# Patient Record
Sex: Male | Born: 1975 | Race: White | Hispanic: No | Marital: Married | Smoking: Current every day smoker
Health system: Southern US, Community
[De-identification: ages and names within clinical notes are randomized; demographics above are authoritative.]

## PROBLEM LIST (undated history)

## (undated) DIAGNOSIS — F419 Anxiety disorder, unspecified: Secondary | ICD-10-CM

## (undated) DIAGNOSIS — I10 Essential (primary) hypertension: Secondary | ICD-10-CM

## (undated) DIAGNOSIS — E785 Hyperlipidemia, unspecified: Secondary | ICD-10-CM

## (undated) HISTORY — DX: Hyperlipidemia, unspecified: E78.5

## (undated) HISTORY — DX: Essential (primary) hypertension: I10

## (undated) HISTORY — DX: Anxiety disorder, unspecified: F41.9

---

## 2006-11-14 ENCOUNTER — Ambulatory Visit: Payer: Self-pay | Admitting: Family Medicine

## 2007-02-07 ENCOUNTER — Emergency Department: Payer: Self-pay | Admitting: Emergency Medicine

## 2011-08-21 ENCOUNTER — Emergency Department: Payer: Self-pay | Admitting: Emergency Medicine

## 2011-08-21 LAB — BASIC METABOLIC PANEL
Calcium, Total: 9.4 mg/dL (ref 8.5–10.1)
Chloride: 103 mmol/L (ref 98–107)
Creatinine: 0.94 mg/dL (ref 0.60–1.30)
EGFR (Non-African Amer.): >60

## 2011-08-21 LAB — CBC
HCT: 45.9 % (ref 40.0–52.0)
MCHC: 33.8 g/dL (ref 32.0–36.0)
MCV: 92 fL (ref 80–100)
Platelet: 218 10*3/uL (ref 150–440)
RBC: 5.01 10*6/uL (ref 4.40–5.90)
WBC: 8.9 10*3/uL (ref 3.8–10.6)

## 2011-08-21 LAB — CK TOTAL AND CKMB (NOT AT ARMC)
CK, Total: 101 U/L (ref 35–232)
CK-MB: 0.7 ng/mL (ref 0.5–3.6)

## 2011-08-21 LAB — TROPONIN I: Troponin-I: <0.02 ng/mL

## 2012-04-16 ENCOUNTER — Ambulatory Visit: Payer: Self-pay | Admitting: Orthopedic Surgery

## 2013-05-18 ENCOUNTER — Ambulatory Visit: Payer: Self-pay | Admitting: Physician Assistant

## 2014-03-31 ENCOUNTER — Ambulatory Visit: Payer: Self-pay | Admitting: Physician Assistant

## 2014-06-17 HISTORY — PX: OTHER SURGICAL HISTORY: SHX169

## 2015-07-13 DIAGNOSIS — F17210 Nicotine dependence, cigarettes, uncomplicated: Secondary | ICD-10-CM | POA: Diagnosis not present

## 2015-07-13 DIAGNOSIS — R1314 Dysphagia, pharyngoesophageal phase: Secondary | ICD-10-CM | POA: Diagnosis not present

## 2015-07-13 DIAGNOSIS — R0602 Shortness of breath: Secondary | ICD-10-CM | POA: Diagnosis not present

## 2015-07-14 ENCOUNTER — Other Ambulatory Visit: Payer: Self-pay | Admitting: Physician Assistant

## 2015-07-14 DIAGNOSIS — R1310 Dysphagia, unspecified: Secondary | ICD-10-CM

## 2015-07-21 ENCOUNTER — Ambulatory Visit
Admission: RE | Admit: 2015-07-21 | Discharge: 2015-07-21 | Disposition: A | Discharge status: Home / Self Care | Payer: BLUE CROSS/BLUE SHIELD | Source: Ambulatory Visit | Attending: Physician Assistant | Admitting: Physician Assistant

## 2015-07-21 ENCOUNTER — Ambulatory Visit: Admission: RE | Admit: 2015-07-21 | Payer: BLUE CROSS/BLUE SHIELD | Source: Ambulatory Visit

## 2015-07-21 DIAGNOSIS — R1310 Dysphagia, unspecified: Secondary | ICD-10-CM | POA: Diagnosis not present

## 2015-07-21 DIAGNOSIS — K219 Gastro-esophageal reflux disease without esophagitis: Secondary | ICD-10-CM | POA: Insufficient documentation

## 2015-07-21 DIAGNOSIS — R1314 Dysphagia, pharyngoesophageal phase: Secondary | ICD-10-CM | POA: Diagnosis not present

## 2015-07-31 DIAGNOSIS — S60451A Superficial foreign body of left index finger, initial encounter: Secondary | ICD-10-CM | POA: Diagnosis not present

## 2015-07-31 DIAGNOSIS — Z887 Allergy status to serum and vaccine status: Secondary | ICD-10-CM | POA: Diagnosis not present

## 2015-07-31 DIAGNOSIS — S62661A Nondisplaced fracture of distal phalanx of left index finger, initial encounter for closed fracture: Secondary | ICD-10-CM | POA: Diagnosis not present

## 2015-08-01 DIAGNOSIS — S60451A Superficial foreign body of left index finger, initial encounter: Secondary | ICD-10-CM | POA: Diagnosis not present

## 2015-08-16 DIAGNOSIS — R1314 Dysphagia, pharyngoesophageal phase: Secondary | ICD-10-CM | POA: Diagnosis not present

## 2015-08-16 DIAGNOSIS — K219 Gastro-esophageal reflux disease without esophagitis: Secondary | ICD-10-CM | POA: Diagnosis not present

## 2015-09-07 DIAGNOSIS — Z6832 Body mass index (BMI) 32.0-32.9, adult: Secondary | ICD-10-CM | POA: Diagnosis not present

## 2015-09-07 DIAGNOSIS — K6289 Other specified diseases of anus and rectum: Secondary | ICD-10-CM | POA: Diagnosis not present

## 2015-09-07 DIAGNOSIS — K611 Rectal abscess: Secondary | ICD-10-CM | POA: Diagnosis not present

## 2015-09-07 DIAGNOSIS — K649 Unspecified hemorrhoids: Secondary | ICD-10-CM | POA: Diagnosis not present

## 2015-12-15 DIAGNOSIS — R1314 Dysphagia, pharyngoesophageal phase: Secondary | ICD-10-CM | POA: Diagnosis not present

## 2015-12-15 DIAGNOSIS — R635 Abnormal weight gain: Secondary | ICD-10-CM | POA: Diagnosis not present

## 2015-12-15 DIAGNOSIS — E291 Testicular hypofunction: Secondary | ICD-10-CM | POA: Diagnosis not present

## 2015-12-15 DIAGNOSIS — Z23 Encounter for immunization: Secondary | ICD-10-CM | POA: Diagnosis not present

## 2016-03-14 DIAGNOSIS — R05 Cough: Secondary | ICD-10-CM | POA: Diagnosis not present

## 2016-03-14 DIAGNOSIS — J209 Acute bronchitis, unspecified: Secondary | ICD-10-CM | POA: Diagnosis not present

## 2016-04-17 DIAGNOSIS — F17210 Nicotine dependence, cigarettes, uncomplicated: Secondary | ICD-10-CM | POA: Diagnosis not present

## 2016-04-17 DIAGNOSIS — Z0001 Encounter for general adult medical examination with abnormal findings: Secondary | ICD-10-CM | POA: Diagnosis not present

## 2016-04-17 DIAGNOSIS — I10 Essential (primary) hypertension: Secondary | ICD-10-CM | POA: Diagnosis not present

## 2016-04-17 DIAGNOSIS — E782 Mixed hyperlipidemia: Secondary | ICD-10-CM | POA: Diagnosis not present

## 2016-04-17 DIAGNOSIS — E291 Testicular hypofunction: Secondary | ICD-10-CM | POA: Diagnosis not present

## 2016-04-24 DIAGNOSIS — I10 Essential (primary) hypertension: Secondary | ICD-10-CM | POA: Diagnosis not present

## 2016-04-24 DIAGNOSIS — E782 Mixed hyperlipidemia: Secondary | ICD-10-CM | POA: Diagnosis not present

## 2016-04-24 DIAGNOSIS — E291 Testicular hypofunction: Secondary | ICD-10-CM | POA: Diagnosis not present

## 2016-08-21 DIAGNOSIS — E291 Testicular hypofunction: Secondary | ICD-10-CM | POA: Diagnosis not present

## 2016-08-21 DIAGNOSIS — F17210 Nicotine dependence, cigarettes, uncomplicated: Secondary | ICD-10-CM | POA: Diagnosis not present

## 2016-08-21 DIAGNOSIS — L723 Sebaceous cyst: Secondary | ICD-10-CM | POA: Diagnosis not present

## 2016-08-21 DIAGNOSIS — I10 Essential (primary) hypertension: Secondary | ICD-10-CM | POA: Diagnosis not present

## 2016-09-13 DIAGNOSIS — K210 Gastro-esophageal reflux disease with esophagitis: Secondary | ICD-10-CM | POA: Diagnosis not present

## 2016-09-13 DIAGNOSIS — R0789 Other chest pain: Secondary | ICD-10-CM | POA: Diagnosis not present

## 2016-11-28 DIAGNOSIS — K219 Gastro-esophageal reflux disease without esophagitis: Secondary | ICD-10-CM | POA: Diagnosis not present

## 2016-11-28 DIAGNOSIS — L723 Sebaceous cyst: Secondary | ICD-10-CM | POA: Diagnosis not present

## 2016-12-13 DIAGNOSIS — L720 Epidermal cyst: Secondary | ICD-10-CM | POA: Diagnosis not present

## 2016-12-13 DIAGNOSIS — L723 Sebaceous cyst: Secondary | ICD-10-CM | POA: Diagnosis not present

## 2016-12-18 DIAGNOSIS — E782 Mixed hyperlipidemia: Secondary | ICD-10-CM | POA: Diagnosis not present

## 2016-12-18 DIAGNOSIS — E291 Testicular hypofunction: Secondary | ICD-10-CM | POA: Diagnosis not present

## 2016-12-18 DIAGNOSIS — I10 Essential (primary) hypertension: Secondary | ICD-10-CM | POA: Diagnosis not present

## 2016-12-19 DIAGNOSIS — E291 Testicular hypofunction: Secondary | ICD-10-CM | POA: Diagnosis not present

## 2016-12-19 DIAGNOSIS — I10 Essential (primary) hypertension: Secondary | ICD-10-CM | POA: Diagnosis not present

## 2016-12-19 DIAGNOSIS — E782 Mixed hyperlipidemia: Secondary | ICD-10-CM | POA: Diagnosis not present

## 2016-12-19 DIAGNOSIS — Z23 Encounter for immunization: Secondary | ICD-10-CM | POA: Diagnosis not present

## 2016-12-19 DIAGNOSIS — F17211 Nicotine dependence, cigarettes, in remission: Secondary | ICD-10-CM | POA: Diagnosis not present

## 2017-01-10 DIAGNOSIS — E291 Testicular hypofunction: Secondary | ICD-10-CM | POA: Diagnosis not present

## 2017-01-10 DIAGNOSIS — E782 Mixed hyperlipidemia: Secondary | ICD-10-CM | POA: Diagnosis not present

## 2017-01-10 DIAGNOSIS — I10 Essential (primary) hypertension: Secondary | ICD-10-CM | POA: Diagnosis not present

## 2017-01-14 DIAGNOSIS — C649 Malignant neoplasm of unspecified kidney, except renal pelvis: Secondary | ICD-10-CM | POA: Diagnosis not present

## 2017-02-07 DIAGNOSIS — K219 Gastro-esophageal reflux disease without esophagitis: Secondary | ICD-10-CM | POA: Diagnosis not present

## 2017-03-15 ENCOUNTER — Other Ambulatory Visit: Payer: Self-pay

## 2017-03-15 MED ORDER — LOSARTAN POTASSIUM-HCTZ 100-12.5 MG PO TABS: 1.0000 | ORAL_TABLET | Freq: Every day | ORAL | 6 refills | Status: DC

## 2017-03-15 MED ORDER — SIMVASTATIN 20 MG PO TABS: 20.0000 mg | ORAL_TABLET | Freq: Every day | ORAL | 3 refills | Status: DC

## 2017-04-12 ENCOUNTER — Encounter: Payer: Self-pay | Admitting: Nurse Practitioner

## 2017-04-12 ENCOUNTER — Ambulatory Visit: Payer: BLUE CROSS/BLUE SHIELD | Admitting: Nurse Practitioner

## 2017-04-12 VITALS — BP 120/82 | HR 90 | Resp 16 | Ht 74.0 in | Wt 258.0 lb

## 2017-04-12 DIAGNOSIS — I10 Essential (primary) hypertension: Secondary | ICD-10-CM

## 2017-04-12 DIAGNOSIS — E291 Testicular hypofunction: Secondary | ICD-10-CM

## 2017-04-12 DIAGNOSIS — F411 Generalized anxiety disorder: Secondary | ICD-10-CM | POA: Diagnosis not present

## 2017-04-12 DIAGNOSIS — E785 Hyperlipidemia, unspecified: Secondary | ICD-10-CM | POA: Diagnosis not present

## 2017-04-12 MED ORDER — TESTOSTERONE CYPIONATE 100 MG/ML IM SOLN: 200.0000 mg | INTRAMUSCULAR | 3 refills | Status: DC

## 2017-04-12 MED ORDER — FLUOXETINE HCL 10 MG PO CAPS: 10.0000 mg | ORAL_CAPSULE | Freq: Every day | ORAL | 3 refills | Status: DC

## 2017-04-12 NOTE — Progress Notes (Signed)
[facility] [place], [place] [address]  Internal MEDICINE  Office Visit Note  Patient Name: Stephen Lowery  [id number]  [id number]  Date of Service: 04/12/2017  No chief complaint on file.   The patient is here for routine follow up. Is currently on testosterone injections. Hsa increased his energy levels and has helped with his focus. He does admit increased acne on his back. He is also in the office with his wife. She does state that has developed some anger issues which are causing some problems in the marriage. He does have history of significant ADHD as teenager and young adult. Was treated with multiple medications and does not want to be over medicated at any point.     Pt is here for routine follow up.    Current Medication: Outpatient Encounter Medications as of 04/12/2017  Medication Sig  . albuterol (PROVENTIL HFA;VENTOLIN HFA) 108 (90 Base) MCG/ACT inhaler Inhale 2 puffs into the lungs every 6 (six) hours as needed for wheezing or shortness of breath.  . B-D INTEGRA SYRINGE 22G X 1-1/2" 3 ML MISC U Q 2 WKS WITH TESTOSTERONE  . busPIRone (BUSPAR) 10 MG tablet Take 10 mg by mouth daily.  Marland Kitchen losartan-hydrochlorothiazide (HYZAAR) 100-12.5 MG tablet Take 1 tablet by mouth daily.  . minocycline (MINOCIN,DYNACIN) 100 MG capsule TK 1 C PO D FOR ACNE  . omeprazole (PRILOSEC) 40 MG capsule TK 1 C PO QD 30 MIN B THE EVE MEAL  . [DISCONTINUED] simvastatin (ZOCOR) 20 MG tablet Take 1 tablet (20 mg total) by mouth daily.  . [DISCONTINUED] testosterone cypionate (DEPOTESTOSTERONE CYPIONATE) 200 MG/ML injection INJECT 1.5ML  Q 2 WEEKS  . FLUoxetine (PROZAC) 10 MG capsule Take 1 capsule (10 mg total) by mouth daily.  . rosuvastatin (CRESTOR) 10 MG tablet TK 1 T PO NIGHTLY FOR CHOLESTEROL  . testosterone cypionate (DEPO-TESTOSTERONE) 100 MG/ML injection Inject 2 mLs (200 mg total) into the muscle every 14 (fourteen) days. For IM use only   No facility-administered  encounter medications on file as of 04/12/2017.     Surgical History: Past Surgical History:  Procedure Laterality Date  . right hip replacement Right 06/17/2014    Medical History: Past Medical History:  Diagnosis Date  . Anxiety   . Hyperlipidemia     Family History: Family History  Problem Relation Age of Onset  . Hypertension Mother   . Hyperlipidemia Father   . Hypertension Father     Social History   Socioeconomic History  . Marital status: Married    Spouse name: Not on file  . Number of children: Not on file  . Years of education: Not on file  . Highest education level: Not on file  Social Needs  . Financial resource strain: Not on file  . Food insecurity - worry: Not on file  . Food insecurity - inability: Not on file  . Transportation needs - medical: Not on file  . Transportation needs - non-medical: Not on file  Occupational History  . Not on file  Tobacco Use  . Smoking status: Former Smoker    Types: Cigarettes    Last attempt to quit: 11/26/2016    Years since quitting: 0.3  . Smokeless tobacco: Never Used  Substance and Sexual Activity  . Alcohol use: Yes    Frequency: Never    Comment: social  . Drug use: No  . Sexual activity: Not on file  Other Topics Concern  . Not on file  Social History Narrative  . Not on file      Review of Systems  Constitutional: Negative for activity change and unexpected weight change.  HENT: Positive for ear pain.        Right ear pain.   Eyes: Negative.  Negative for discharge.  Respiratory: Negative for chest tightness and shortness of breath.   Cardiovascular: Negative for chest pain and palpitations.       Blood pressure well controlled.   Gastrointestinal: Negative for abdominal pain, diarrhea, nausea and vomiting.  Endocrine:       History of testosterone hypofunction.   Genitourinary: Negative.   Musculoskeletal: Negative.   Skin:       Increased acne on his back.   Neurological: Negative for  weakness and headaches.  Hematological: Negative.   Psychiatric/Behavioral: Positive for agitation and decreased concentration. The patient is nervous/anxious.     Today's Vitals   04/12/17 0844  BP: 120/82  Pulse: 90  Resp: 16  SpO2: 98%  Weight: 258 lb (117 kg)  Height: 6\' 2"  (1.88 m)    Physical Exam  Constitutional: He is oriented to person, place, and time. He appears well-developed and well-nourished. No distress.  HENT:  Head: Normocephalic and atraumatic.  Right Ear: External ear normal.  Left Ear: External ear normal.  Mouth/Throat: Oropharynx is clear and moist. No oropharyngeal exudate.  Eyes: EOM are normal. Pupils are equal, round, and reactive to light.  Neck: Normal range of motion. Neck supple. No JVD present. No tracheal deviation present. No thyromegaly present.  Cardiovascular: Normal rate, regular rhythm and normal heart sounds. Exam reveals no gallop and no friction rub.  No murmur heard. Pulmonary/Chest: Effort normal and breath sounds normal. No respiratory distress. He has no wheezes. He has no rales. He exhibits no tenderness.  Abdominal: Soft. Bowel sounds are normal. There is no tenderness.  Musculoskeletal: Normal range of motion.  Lymphadenopathy:    He has no cervical adenopathy.  Neurological: He is alert and oriented to person, place, and time. No cranial nerve deficit.  Skin: Skin is warm and dry. He is not diaphoretic.  Psychiatric: He has a normal mood and affect. His behavior is normal. Judgment and thought content normal.  Nursing note and vitals reviewed.   Assessment/Plan: 1. Testicular hypofunction - testosterone cypionate (DEPO-TESTOSTERONE) 100 MG/ML injection; Inject 2 mLs (200 mg total) into the muscle every 14 (fourteen) days. For IM use only  Dispense: 10 mL; Refill: 3 Check testosterone level prior to next visit and adjust dosing as indicated   2. GAD (generalized anxiety disorder) - FLUoxetine (PROZAC) 10 MG capsule; Take 1  capsule (10 mg total) by mouth daily.  Dispense: 30 capsule; Refill: 3 Continue to take BusSpar as needed and as prescribed  3. Essential hypertension Well controlled. Continue bp medication as prescribed   4. Hyperlipidemia, unspecified hyperlipidemia type Check fasting lipid panel prior to next visit. Adjust crestor as indicated .  General Counseling: Niccolas verbalizes understanding of the findings of todays visit and agrees with plan of treatment. I have discussed any further diagnostic evaluation that may be needed or ordered today. We also reviewed his medications today. he has been encouraged to call the office with any questions or concerns that should arise related to todays visit.    This patient was seen by Leretha Pol, FNP- C in Collaboration with Dr Lavera Guise as a part of collaborative care agreement      Time spent: 15 Minutes  Dr Lavera Guise Internal medicine

## 2017-06-17 ENCOUNTER — Ambulatory Visit (INDEPENDENT_AMBULATORY_CARE_PROVIDER_SITE_OTHER): Payer: BLUE CROSS/BLUE SHIELD | Admitting: Nurse Practitioner

## 2017-06-17 ENCOUNTER — Telehealth: Payer: Self-pay

## 2017-06-17 ENCOUNTER — Telehealth: Payer: Self-pay | Admitting: Nurse Practitioner

## 2017-06-17 ENCOUNTER — Encounter: Payer: Self-pay | Admitting: Nurse Practitioner

## 2017-06-17 VITALS — BP 138/100 | HR 70 | Resp 16 | Ht 74.0 in | Wt 263.0 lb

## 2017-06-17 DIAGNOSIS — Z8349 Family history of other endocrine, nutritional and metabolic diseases: Secondary | ICD-10-CM | POA: Diagnosis not present

## 2017-06-17 DIAGNOSIS — Z0001 Encounter for general adult medical examination with abnormal findings: Secondary | ICD-10-CM | POA: Diagnosis not present

## 2017-06-17 DIAGNOSIS — Z8489 Family history of other specified conditions: Secondary | ICD-10-CM

## 2017-06-17 DIAGNOSIS — R300 Dysuria: Secondary | ICD-10-CM | POA: Diagnosis not present

## 2017-06-17 DIAGNOSIS — I10 Essential (primary) hypertension: Secondary | ICD-10-CM

## 2017-06-17 DIAGNOSIS — E291 Testicular hypofunction: Secondary | ICD-10-CM | POA: Diagnosis not present

## 2017-06-17 MED ORDER — "BD INTEGRA SYRINGE 22G X 1-1/2"" 3 ML MISC": 5 refills | Status: DC

## 2017-06-17 MED ORDER — TESTOSTERONE CYPIONATE 100 MG/ML IM SOLN: 200.0000 mg | INTRAMUSCULAR | 3 refills | Status: DC

## 2017-06-17 NOTE — Telephone Encounter (Signed)
Patient has been advised that he is scheduled for Mri Brain w/wo on 06/26/17 @ 8:45 [place].Titania

## 2017-06-17 NOTE — Progress Notes (Signed)
[facility] [place],  [id number]  Internal MEDICINE  Office Visit Note  Patient Name: Stephen Lowery  [id number]  [id number]  Date of Service: 06/17/2017  Chief Complaint  Patient presents with  . Hypertension     The patient is here for health maintenance exam. Today, his blood pressure is elevated. Has not taken his blood pressure medication all weekend. Routine gets off and he forgets to take it. He did take it this morning, he says.  Still having low testosterone, despite being on testosterone injections. States that he has STRONG family history of pituitary tumors (dad, grandfather, and dad's cousin or uncle. His dad is going, tomorrow, to have tumor removed from pituitary gland. Patient has not headaches or issues with peripheral vision. No doziness or nausea reported.  He is also not taking Prozac as prescribed. States that his wife really insisted he take the medication. States that they are having marital issues and this causes him to be irritable and fatigued.   Pt is here for routine health maintenance examination  Current Medication: Outpatient Encounter Medications as of 06/17/2017  Medication Sig  . busPIRone (BUSPAR) 10 MG tablet Take 10 mg by mouth daily.  Marland Kitchen losartan-hydrochlorothiazide (HYZAAR) 100-12.5 MG tablet Take 1 tablet by mouth daily.  Marland Kitchen omeprazole (PRILOSEC) 40 MG capsule TK 1 C PO QD 30 MIN B THE EVE MEAL  . albuterol (PROVENTIL HFA;VENTOLIN HFA) 108 (90 Base) MCG/ACT inhaler Inhale 2 puffs into the lungs every 6 (six) hours as needed for wheezing or shortness of breath.  . B-D INTEGRA SYRINGE 22G X 1-1/2" 3 ML MISC U Q 2 WKS WITH TESTOSTERONE  . FLUoxetine (PROZAC) 10 MG capsule Take 1 capsule (10 mg total) by mouth daily. (Patient not taking: Reported on 06/17/2017)  . minocycline (MINOCIN,DYNACIN) 100 MG capsule TK 1 C PO D FOR ACNE  . rosuvastatin (CRESTOR) 10 MG tablet TK 1 T PO NIGHTLY FOR CHOLESTEROL  . testosterone  cypionate (DEPO-TESTOSTERONE) 100 MG/ML injection Inject 2 mLs (200 mg total) into the muscle every 14 (fourteen) days. For IM use only   No facility-administered encounter medications on file as of 06/17/2017.     Surgical History: Past Surgical History:  Procedure Laterality Date  . right hip replacement Right 06/17/2014    Medical History: Past Medical History:  Diagnosis Date  . Anxiety   . Hyperlipidemia     Family History: Family History  Problem Relation Age of Onset  . Hypertension Mother   . Hyperlipidemia Father   . Hypertension Father       Review of Systems  Constitutional: Negative for activity change and unexpected weight change.  HENT: Negative for ear pain.        Right ear pain.   Eyes: Negative.  Negative for discharge.  Respiratory: Negative for chest tightness and shortness of breath.   Cardiovascular: Negative for chest pain and palpitations.       Blood pressure elevated today.  Gastrointestinal: Negative for abdominal pain, diarrhea, nausea and vomiting.  Endocrine:       History of testosterone hypofunction.   Genitourinary: Negative.   Musculoskeletal: Negative.   Skin:       Increased acne on his back.   Allergic/Immunologic: Negative for environmental allergies.  Neurological: Positive for headaches. Negative for weakness.  Hematological: Negative.   Psychiatric/Behavioral: Positive for agitation. Negative for decreased concentration. The patient is not nervous/anxious.      Today's Vitals   06/17/17 9233  BP: (!) 157/103  Pulse: 70  Resp: 16  SpO2: 98%  Weight: 263 lb (119.3 kg)  Height: 6\' 2"  (1.88 m)    Physical Exam  Constitutional: He is oriented to person, place, and time. He appears well-developed and well-nourished. No distress.  HENT:  Head: Normocephalic and atraumatic.  Right Ear: External ear normal.  Left Ear: External ear normal.  Mouth/Throat: Oropharynx is clear and moist. No oropharyngeal exudate.  Eyes:  Pupils are equal, round, and reactive to light. EOM are normal.  Neck: Normal range of motion. Neck supple. No JVD present. Carotid bruit is not present. No tracheal deviation present. No thyromegaly present.  Cardiovascular: Normal rate, regular rhythm, normal heart sounds and intact distal pulses. Exam reveals no gallop and no friction rub.  No murmur heard. Pulmonary/Chest: Effort normal and breath sounds normal. No respiratory distress. He has no wheezes. He has no rales. He exhibits no tenderness.  Abdominal: Soft. Bowel sounds are normal. There is no tenderness.  Musculoskeletal: Normal range of motion.  Lymphadenopathy:    He has no cervical adenopathy.  Neurological: He is alert and oriented to person, place, and time. No cranial nerve deficit.  Skin: Skin is warm and dry. He is not diaphoretic.  Psychiatric: He has a normal mood and affect. His behavior is normal. Judgment and thought content normal.  Nursing note and vitals reviewed.  Assessment/Plan: 1. Encounter for general adult medical examination with abnormal findings Annual wellness visit today  2. Essential hypertension Stable. Continue losartan/HCTZ as prescribed.  3. Testicular hypofunction Continue testosterone injections 200mg  every 14 days. Recheck levels prior to next visit and adjust as indicated.  - MR Brain W Wo Contrast; Future  4. Dysuria - Urinalysis, Routine w reflex microscopic  5. Family history of benign neoplasm of pituitary gland and craniopharyngeal duct Father, grandfather, and father's cousin all with pituitary tumors. Will get MRI for further evaluation.  - MR Brain W Wo Contrast; Future  General Counseling: [name] verbalizes understanding of the findings of todays visit and agrees with plan of treatment. I have discussed any further diagnostic evaluation that may be needed or ordered today. We also reviewed his medications today. he has been encouraged to call the office with any questions or  concerns that should arise related to todays visit.  Hypertension Counseling:   The following hypertensive lifestyle modification were recommended and discussed:  1. Limiting alcohol intake to less than 1 oz/day of ethanol:(24 oz of beer or 8 oz of wine or 2 oz of 100-proof whiskey). 2. Take baby ASA 81 mg daily. 3. Importance of regular aerobic exercise and losing weight. 4. Reduce dietary saturated fat and cholesterol intake for overall cardiovascular health. 5. Maintaining adequate dietary potassium, calcium, and magnesium intake. 6. Regular monitoring of the blood pressure. 7. Reduce sodium intake to less than 100 mmol/day (less than 2.3 gm of sodium or less than 6 gm of sodium choride)   This patient was seen by Leretha Pol, FNP- C in Collaboration with Dr Lavera Guise as a part of collaborative care agreement   Orders Placed This Encounter  Procedures  . MR Brain W Wo Contrast  . Urinalysis, Routine w reflex microscopic      Time spent: [place], MD  Internal Medicine

## 2017-06-18 LAB — URINALYSIS, ROUTINE W REFLEX MICROSCOPIC
Bilirubin, UA: NEGATIVE
GLUCOSE, UA: NEGATIVE
Ketones, UA: NEGATIVE
LEUKOCYTES UA: NEGATIVE
Nitrite, UA: NEGATIVE
PH UA: 5.5 (ref 5.0–7.5)
PROTEIN UA: NEGATIVE
RBC, UA: NEGATIVE
Specific Gravity, UA: 1.018 (ref 1.005–1.030)
UUROB: 0.2 mg/dL (ref 0.2–1.0)

## 2017-06-19 ENCOUNTER — Telehealth: Payer: Self-pay

## 2017-06-19 ENCOUNTER — Other Ambulatory Visit: Payer: Self-pay | Admitting: Nurse Practitioner

## 2017-06-19 DIAGNOSIS — F411 Generalized anxiety disorder: Secondary | ICD-10-CM

## 2017-06-19 MED ORDER — DIAZEPAM 5 MG PO TABS: ORAL_TABLET | ORAL | 0 refills | Status: DC

## 2017-06-19 NOTE — Telephone Encounter (Signed)
Left message advising patient that Nira Conn has added diazepam 5mg . Take 1 tablet PO 1 hour prior to procedure. May repeat dose 15 minutes prior to procedure if needed.will need driver for this procedure.[place]

## 2017-06-19 NOTE — Telephone Encounter (Signed)
Patient reporting claustrophobia and is scheduled to have MRI of the brain. Added diazepam 5mg . Take 1 tablet PO 1 hour prior to procedure. May repeat dose 15 minutes prior to procedure if needed.will need driver for this procedure.

## 2017-06-19 NOTE — Progress Notes (Signed)
Patient reporting claustrophobia and is scheduled to have MRI of the brain. Added diazepam 5mg . Take 1 tablet PO 1 hour prior to procedure. May repeat dose 15 minutes prior to procedure if needed.will need driver for this procedure.

## 2017-06-26 ENCOUNTER — Other Ambulatory Visit
Admission: RE | Admit: 2017-06-26 | Discharge: 2017-06-26 | Disposition: A | Discharge status: Home / Self Care | Payer: BLUE CROSS/BLUE SHIELD | Source: Ambulatory Visit | Attending: Nurse Practitioner | Admitting: Nurse Practitioner

## 2017-06-26 ENCOUNTER — Other Ambulatory Visit: Payer: Self-pay | Admitting: Nurse Practitioner

## 2017-06-26 ENCOUNTER — Ambulatory Visit
Admission: RE | Admit: 2017-06-26 | Discharge: 2017-06-26 | Disposition: A | Discharge status: Home / Self Care | Payer: BLUE CROSS/BLUE SHIELD | Source: Ambulatory Visit | Attending: Nurse Practitioner | Admitting: Nurse Practitioner

## 2017-06-26 DIAGNOSIS — Z8489 Family history of other specified conditions: Secondary | ICD-10-CM | POA: Diagnosis not present

## 2017-06-26 DIAGNOSIS — E291 Testicular hypofunction: Secondary | ICD-10-CM | POA: Diagnosis not present

## 2017-06-26 DIAGNOSIS — Z01812 Encounter for preprocedural laboratory examination: Secondary | ICD-10-CM

## 2017-06-26 DIAGNOSIS — D352 Benign neoplasm of pituitary gland: Secondary | ICD-10-CM | POA: Diagnosis not present

## 2017-06-26 LAB — BASIC METABOLIC PANEL
Anion gap: 9 (ref 5–15)
BUN: 14 mg/dL (ref 6–20)
CALCIUM: 9.4 mg/dL (ref 8.9–10.3)
CO2: 25 mmol/L (ref 22–32)
Chloride: 102 mmol/L (ref 101–111)
Creatinine, Ser: 1.18 mg/dL (ref 0.61–1.24)
Glucose, Bld: 117 mg/dL — ABNORMAL HIGH (ref 65–99)
Potassium: 4.2 mmol/L (ref 3.5–5.1)
SODIUM: 136 mmol/L (ref 135–145)

## 2017-06-26 MED ORDER — GADOBENATE DIMEGLUMINE 529 MG/ML IV SOLN
10.0000 mL | Freq: Once | INTRAVENOUS | Status: AC | PRN
  Administered 2017-06-26: 10 mL via INTRAVENOUS

## 2017-06-26 NOTE — Progress Notes (Signed)
Added bmp per hospital request prior to MRI

## 2017-07-05 DIAGNOSIS — E291 Testicular hypofunction: Secondary | ICD-10-CM | POA: Diagnosis not present

## 2017-07-05 DIAGNOSIS — R220 Localized swelling, mass and lump, head: Secondary | ICD-10-CM | POA: Diagnosis not present

## 2017-07-08 DIAGNOSIS — E237 Disorder of pituitary gland, unspecified: Secondary | ICD-10-CM | POA: Diagnosis not present

## 2017-07-08 DIAGNOSIS — Z87891 Personal history of nicotine dependence: Secondary | ICD-10-CM | POA: Diagnosis not present

## 2017-07-08 DIAGNOSIS — Z6832 Body mass index (BMI) 32.0-32.9, adult: Secondary | ICD-10-CM | POA: Diagnosis not present

## 2017-07-15 DIAGNOSIS — Z87442 Personal history of urinary calculi: Secondary | ICD-10-CM | POA: Insufficient documentation

## 2017-07-15 DIAGNOSIS — E230 Hypopituitarism: Secondary | ICD-10-CM | POA: Diagnosis not present

## 2017-07-15 DIAGNOSIS — K219 Gastro-esophageal reflux disease without esophagitis: Secondary | ICD-10-CM | POA: Diagnosis not present

## 2017-07-15 DIAGNOSIS — E785 Hyperlipidemia, unspecified: Secondary | ICD-10-CM | POA: Diagnosis not present

## 2017-07-15 DIAGNOSIS — Z8349 Family history of other endocrine, nutritional and metabolic diseases: Secondary | ICD-10-CM | POA: Diagnosis not present

## 2017-07-15 DIAGNOSIS — Z96641 Presence of right artificial hip joint: Secondary | ICD-10-CM | POA: Diagnosis not present

## 2017-07-15 DIAGNOSIS — Z8489 Family history of other specified conditions: Secondary | ICD-10-CM | POA: Diagnosis not present

## 2017-07-15 DIAGNOSIS — I10 Essential (primary) hypertension: Secondary | ICD-10-CM | POA: Diagnosis not present

## 2017-07-15 DIAGNOSIS — R0681 Apnea, not elsewhere classified: Secondary | ICD-10-CM | POA: Insufficient documentation

## 2017-07-16 ENCOUNTER — Ambulatory Visit: Payer: Self-pay | Admitting: Nurse Practitioner

## 2017-08-20 ENCOUNTER — Other Ambulatory Visit: Payer: Self-pay

## 2017-08-20 ENCOUNTER — Telehealth: Payer: Self-pay

## 2017-08-20 NOTE — Telephone Encounter (Signed)
LMOM ABOUT MED THAT HE IS ON SIMVASTATIN 20 MG  OR HEIS ON CRESTOR

## 2017-08-22 ENCOUNTER — Other Ambulatory Visit: Payer: Self-pay | Admitting: Nurse Practitioner

## 2017-08-26 ENCOUNTER — Other Ambulatory Visit: Payer: Self-pay | Admitting: Nurse Practitioner

## 2017-08-26 DIAGNOSIS — E785 Hyperlipidemia, unspecified: Secondary | ICD-10-CM

## 2017-08-26 MED ORDER — ROSUVASTATIN CALCIUM 10 MG PO TABS: 10.0000 mg | ORAL_TABLET | Freq: Every day | ORAL | 5 refills | Status: DC

## 2017-08-26 NOTE — Progress Notes (Signed)
Refilled patient's crestor 10mg  daily. Sent #30 with 5 refills to his pharmacy

## 2017-09-10 ENCOUNTER — Other Ambulatory Visit: Payer: Self-pay

## 2017-09-10 MED ORDER — MINOCYCLINE HCL 100 MG PO CAPS: ORAL_CAPSULE | ORAL | 5 refills | Status: DC

## 2017-09-17 ENCOUNTER — Encounter: Payer: Self-pay | Admitting: Nurse Practitioner

## 2017-09-17 ENCOUNTER — Ambulatory Visit: Payer: BLUE CROSS/BLUE SHIELD | Admitting: Nurse Practitioner

## 2017-09-17 VITALS — BP 148/106 | Resp 16 | Ht 74.0 in | Wt 260.6 lb

## 2017-09-17 DIAGNOSIS — I10 Essential (primary) hypertension: Secondary | ICD-10-CM

## 2017-09-17 DIAGNOSIS — D352 Benign neoplasm of pituitary gland: Secondary | ICD-10-CM | POA: Diagnosis not present

## 2017-09-17 DIAGNOSIS — E785 Hyperlipidemia, unspecified: Secondary | ICD-10-CM

## 2017-09-17 DIAGNOSIS — E291 Testicular hypofunction: Secondary | ICD-10-CM

## 2017-09-17 MED ORDER — "BD INTEGRA SYRINGE 22G X 1-1/2"" 3 ML MISC": 5 refills | Status: DC

## 2017-09-17 MED ORDER — TESTOSTERONE CYPIONATE 100 MG/ML IM SOLN: 200.0000 mg | INTRAMUSCULAR | 3 refills | Status: DC

## 2017-09-17 NOTE — Progress Notes (Signed)
[facility] [place],  [address]  Internal MEDICINE  Office Visit Note  Patient Name: Stephen Lowery  [id number]  [id number]  Date of Service: 09/17/2017   Pt is here for routine follow up.   Chief Complaint  Patient presents with  . Hypertension  . Results    NRI brain    Patient has not taken blood pressure medication today. Had been out for a few days at the end of last week prior to getting refill.  Did have MRI of the brain since his last visit. testosteroni levels not improving even though on testosterone injections every 2 weeks. 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 Strong family history of this. His brother, father, and grandfather all have same pituitary adenomas. He is now seeing neuro/oncology as well as endocrinology. Testosterone level checked 06/2017 and remains very low.   Hypertension  This is a chronic problem. The current episode started more than 1 year ago. The problem is unchanged. The problem is resistant. Associated symptoms include headaches. Pertinent negatives include no chest pain, palpitations or shortness of breath. Agents associated with hypertension include steroids. Risk factors for coronary artery disease include dyslipidemia, male gender, smoking/tobacco exposure and stress. Past treatments include ACE inhibitors and diuretics. The current treatment provides mild improvement. Compliance problems include exercise and diet.         Current Medication: Outpatient Encounter Medications as of 09/17/2017  Medication Sig  . albuterol (PROVENTIL HFA;VENTOLIN HFA) 108 (90 Base) MCG/ACT inhaler Inhale 2 puffs into the lungs every 6 (six) hours as needed for wheezing or shortness of breath.  . B-D INTEGRA SYRINGE 22G X 1-1/2" 3 ML MISC To use for testosterone injections.  . busPIRone (BUSPAR) 10 MG tablet Take 10 mg by mouth daily.  . diazepam (VALIUM) 5 MG tablet Take 1 tablet PO 1 hour prior to procedure. May repeat dose 15 minutes  prior to procedure if needed.  Marland Kitchen FLUoxetine (PROZAC) 10 MG capsule Take 1 capsule (10 mg total) by mouth daily.  Marland Kitchen losartan-hydrochlorothiazide (HYZAAR) 100-12.5 MG tablet Take 1 tablet by mouth daily.  . minocycline (MINOCIN,DYNACIN) 100 MG capsule TK 1 C PO D FOR ACNE  . omeprazole (PRILOSEC) 40 MG capsule TK 1 C PO QD 30 MIN B THE EVE MEAL  . rosuvastatin (CRESTOR) 10 MG tablet Take 1 tablet (10 mg total) by mouth daily.  Marland Kitchen testosterone cypionate (DEPO-TESTOSTERONE) 100 MG/ML injection Inject 2 mLs (200 mg total) into the muscle every 14 (fourteen) days. For IM use only  . [DISCONTINUED] B-D INTEGRA SYRINGE 22G X 1-1/2" 3 ML MISC To use for testosterone injections.  . [DISCONTINUED] testosterone cypionate (DEPO-TESTOSTERONE) 100 MG/ML injection Inject 2 mLs (200 mg total) into the muscle every 14 (fourteen) days. For IM use only   No facility-administered encounter medications on file as of 09/17/2017.     Surgical History: Past Surgical History:  Procedure Laterality Date  . right hip replacement Right 06/17/2014    Medical History: Past Medical History:  Diagnosis Date  . Anxiety   . Hyperlipidemia     Family History: Family History  Problem Relation Age of Onset  . Hypertension Mother   . Hyperlipidemia Father   . Hypertension Father     Social History   Socioeconomic History  . Marital status: Married    Spouse name: Not on file  . Number of children: Not on file  . Years of education: Not on file  . Highest education level: Not on file  Occupational History  . Not on file  Social Needs  . Financial resource strain: Not on file  . Food insecurity:    Worry: Not on file    Inability: Not on file  . Transportation needs:    Medical: Not on file    Non-medical: Not on file  Tobacco Use  . Smoking status: Current Every Day Smoker    Types: Cigarettes    Start date: 09/03/2017  . Smokeless tobacco: Never Used  Substance and Sexual Activity  . Alcohol use: Yes     Frequency: Never    Comment: social  . Drug use: No  . Sexual activity: Not on file  Lifestyle  . Physical activity:    Days per week: Not on file    Minutes per session: Not on file  . Stress: Not on file  Relationships  . Social connections:    Talks on phone: Not on file    Gets together: Not on file    Attends religious service: Not on file    Active member of club or organization: Not on file    Attends meetings of clubs or organizations: Not on file    Relationship status: Not on file  . Intimate partner violence:    Fear of current or ex partner: Not on file    Emotionally abused: Not on file    Physically abused: Not on file    Forced sexual activity: Not on file  Other Topics Concern  . Not on file  Social History Narrative  . Not on file      Review of Systems  Constitutional: Positive for fatigue. Negative for activity change and unexpected weight change.  HENT: Negative for ear pain.        Right ear pain.   Eyes: Negative.  Negative for discharge.  Respiratory: Negative for chest tightness and shortness of breath.   Cardiovascular: Negative for chest pain and palpitations.       Blood pressure elevated today. Has not taken regular medications.   Gastrointestinal: Negative for abdominal pain, diarrhea, nausea and vomiting.  Endocrine:       History of testosterone hypofunction.   Genitourinary: Negative for dysuria, frequency and hematuria.  Musculoskeletal: Negative for arthralgias and back pain.  Skin:       Increased acne on his back.   Allergic/Immunologic: Negative for environmental allergies.  Neurological: Positive for headaches. Negative for weakness.  Hematological: Negative for adenopathy.  Psychiatric/Behavioral: Positive for agitation and dysphoric mood. Negative for decreased concentration. The patient is not nervous/anxious.        Improved. Taking buspirone only when needed. Not taking fluoxetine at all.     Today's Vitals   09/17/17  0852  BP: (!) 148/106  Resp: 16  SpO2: 100%  Weight: 260 lb 9.6 oz (118.2 kg)  Height: 6\' 2"  (1.88 m)    Physical Exam  Constitutional: He is oriented to person, place, and time. He appears well-developed and well-nourished. No distress.  HENT:  Head: Normocephalic and atraumatic.  Right Ear: External ear normal.  Left Ear: External ear normal.  Mouth/Throat: Oropharynx is clear and moist. No oropharyngeal exudate.  Eyes: Pupils are equal, round, and reactive to light. EOM are normal.  Neck: Normal range of motion. Neck supple. No JVD present. Carotid bruit is not present. No tracheal deviation present. No thyromegaly present.  Cardiovascular: Normal rate, regular rhythm, normal heart sounds and intact distal pulses. Exam reveals no gallop and no friction rub.  No murmur heard. Pulmonary/Chest: Effort normal and breath sounds normal. No respiratory distress. He has no wheezes. He has no rales. He exhibits no tenderness.  Abdominal: Soft. Bowel sounds are normal. There is no tenderness.  Musculoskeletal: Normal range of motion.  Lymphadenopathy:    He has no cervical adenopathy.  Neurological: He is alert and oriented to person, place, and time. No cranial nerve deficit.  Skin: Skin is warm and dry. He is not diaphoretic.  Psychiatric: He has a normal mood and affect. His behavior is normal. Judgment and thought content normal.  Nursing note and vitals reviewed.  Assessment/Plan: 1. Essential hypertension Generally stable, but needs to take BP medication every day. Patient voiced understanding. Will be taking med as soon as he leaves the office.   2. Hyperlipidemia, unspecified hyperlipidemia type Continue crestor as prescribed.   3. Pituitary microadenoma ([place]) Continue to monitor with neuro-oncology and endocrinology.   4. Testicular hypofunction Levels still very low, but no changes made to testosterone dosing. Continue 200mg  every 2 weeks. Refills provided today.  -  testosterone cypionate (DEPO-TESTOSTERONE) 100 MG/ML injection; Inject 2 mLs (200 mg total) into the muscle every 14 (fourteen) days. For IM use only  Dispense: 10 mL; Refill: 3 - B-D INTEGRA SYRINGE 22G X 1-1/2" 3 ML MISC; To use for testosterone injections.  Dispense: 5 each; Refill: 5  General Counseling: Bolton verbalizes understanding of the findings of todays visit and agrees with plan of treatment. I have discussed any further diagnostic evaluation that may be needed or ordered today. We also reviewed his medications today. he has been encouraged to call the office with any questions or concerns that should arise related to todays visit.    Counseling:  Hypertension Counseling:   The following hypertensive lifestyle modification were recommended and discussed:  1. Limiting alcohol intake to less than 1 oz/day of ethanol:(24 oz of beer or 8 oz of wine or 2 oz of 100-proof whiskey). 2. Take baby ASA 81 mg daily. 3. Importance of regular aerobic exercise and losing weight. 4. Reduce dietary saturated fat and cholesterol intake for overall cardiovascular health. 5. Maintaining adequate dietary potassium, calcium, and magnesium intake. 6. Regular monitoring of the blood pressure. 7. Reduce sodium intake to less than 100 mmol/day (less than 2.3 gm of sodium or less than 6 gm of sodium choride)   This patient was seen by Leretha Pol, FNP- C in Collaboration with Dr Lavera Guise as a part of collaborative care agreement  Meds ordered this encounter  Medications  . testosterone cypionate (DEPO-TESTOSTERONE) 100 MG/ML injection    Sig: Inject 2 mLs (200 mg total) into the muscle every 14 (fourteen) days. For IM use only    Dispense:  10 mL    Refill:  3    Order Specific Question:   Supervising Provider    Answer:   Lavera Guise [[id number]]  . B-D INTEGRA SYRINGE 22G X 1-1/2" 3 ML MISC    Sig: To use for testosterone injections.    Dispense:  5 each    Refill:  5    Order Specific  Question:   Supervising Provider    Answer:   Lavera Guise [[id number]]    Time spent: 26 Minutes     Dr Lavera Guise Internal medicine

## 2017-10-07 DIAGNOSIS — G4733 Obstructive sleep apnea (adult) (pediatric): Secondary | ICD-10-CM | POA: Diagnosis not present

## 2017-10-15 DIAGNOSIS — R0681 Apnea, not elsewhere classified: Secondary | ICD-10-CM | POA: Diagnosis not present

## 2017-10-15 DIAGNOSIS — Z8349 Family history of other endocrine, nutritional and metabolic diseases: Secondary | ICD-10-CM | POA: Diagnosis not present

## 2017-10-15 DIAGNOSIS — E230 Hypopituitarism: Secondary | ICD-10-CM | POA: Diagnosis not present

## 2017-10-15 DIAGNOSIS — E237 Disorder of pituitary gland, unspecified: Secondary | ICD-10-CM | POA: Diagnosis not present

## 2017-10-21 DIAGNOSIS — R0681 Apnea, not elsewhere classified: Secondary | ICD-10-CM | POA: Diagnosis not present

## 2017-10-21 DIAGNOSIS — G4733 Obstructive sleep apnea (adult) (pediatric): Secondary | ICD-10-CM | POA: Diagnosis not present

## 2017-10-21 DIAGNOSIS — G4730 Sleep apnea, unspecified: Secondary | ICD-10-CM | POA: Diagnosis not present

## 2017-10-21 DIAGNOSIS — E230 Hypopituitarism: Secondary | ICD-10-CM | POA: Diagnosis not present

## 2017-11-21 DIAGNOSIS — R0681 Apnea, not elsewhere classified: Secondary | ICD-10-CM | POA: Diagnosis not present

## 2017-11-21 DIAGNOSIS — G4733 Obstructive sleep apnea (adult) (pediatric): Secondary | ICD-10-CM | POA: Diagnosis not present

## 2017-11-21 DIAGNOSIS — Z6832 Body mass index (BMI) 32.0-32.9, adult: Secondary | ICD-10-CM | POA: Diagnosis not present

## 2017-11-22 ENCOUNTER — Other Ambulatory Visit: Payer: Self-pay

## 2017-11-22 DIAGNOSIS — E785 Hyperlipidemia, unspecified: Secondary | ICD-10-CM

## 2017-11-22 MED ORDER — ROSUVASTATIN CALCIUM 10 MG PO TABS: 10.0000 mg | ORAL_TABLET | Freq: Every day | ORAL | 5 refills | Status: DC

## 2017-11-22 MED ORDER — LOSARTAN POTASSIUM-HCTZ 100-12.5 MG PO TABS: 1.0000 | ORAL_TABLET | Freq: Every day | ORAL | 6 refills | Status: DC

## 2017-11-26 DIAGNOSIS — G4733 Obstructive sleep apnea (adult) (pediatric): Secondary | ICD-10-CM | POA: Diagnosis not present

## 2017-12-20 ENCOUNTER — Ambulatory Visit: Payer: Self-pay | Admitting: Nurse Practitioner

## 2017-12-26 DIAGNOSIS — G4733 Obstructive sleep apnea (adult) (pediatric): Secondary | ICD-10-CM | POA: Diagnosis not present

## 2018-01-26 DIAGNOSIS — G4733 Obstructive sleep apnea (adult) (pediatric): Secondary | ICD-10-CM | POA: Diagnosis not present

## 2018-02-25 DIAGNOSIS — G4733 Obstructive sleep apnea (adult) (pediatric): Secondary | ICD-10-CM | POA: Diagnosis not present

## 2018-03-06 DIAGNOSIS — G4733 Obstructive sleep apnea (adult) (pediatric): Secondary | ICD-10-CM | POA: Diagnosis not present

## 2018-03-15 DIAGNOSIS — E236 Other disorders of pituitary gland: Secondary | ICD-10-CM | POA: Diagnosis not present

## 2018-03-15 DIAGNOSIS — E237 Disorder of pituitary gland, unspecified: Secondary | ICD-10-CM | POA: Diagnosis not present

## 2018-03-28 DIAGNOSIS — G4733 Obstructive sleep apnea (adult) (pediatric): Secondary | ICD-10-CM | POA: Diagnosis not present

## 2018-03-31 ENCOUNTER — Other Ambulatory Visit: Payer: Self-pay

## 2018-04-01 ENCOUNTER — Other Ambulatory Visit: Payer: Self-pay

## 2018-04-01 MED ORDER — LOSARTAN POTASSIUM-HCTZ 100-12.5 MG PO TABS: 1.0000 | ORAL_TABLET | Freq: Every day | ORAL | 6 refills | Status: DC

## 2018-04-15 ENCOUNTER — Ambulatory Visit: Payer: BC Managed Care – PPO | Admitting: Nurse Practitioner

## 2018-04-15 ENCOUNTER — Encounter: Payer: Self-pay | Admitting: Nurse Practitioner

## 2018-04-15 VITALS — BP 147/91 | HR 72 | Resp 16 | Ht 74.0 in | Wt 267.8 lb

## 2018-04-15 DIAGNOSIS — Z125 Encounter for screening for malignant neoplasm of prostate: Secondary | ICD-10-CM

## 2018-04-15 DIAGNOSIS — F411 Generalized anxiety disorder: Secondary | ICD-10-CM

## 2018-04-15 DIAGNOSIS — I10 Essential (primary) hypertension: Secondary | ICD-10-CM | POA: Diagnosis not present

## 2018-04-15 DIAGNOSIS — E785 Hyperlipidemia, unspecified: Secondary | ICD-10-CM | POA: Diagnosis not present

## 2018-04-15 DIAGNOSIS — D352 Benign neoplasm of pituitary gland: Secondary | ICD-10-CM

## 2018-04-15 DIAGNOSIS — E291 Testicular hypofunction: Secondary | ICD-10-CM | POA: Diagnosis not present

## 2018-04-15 MED ORDER — "BD INTEGRA SYRINGE 22G X 1-1/2"" 3 ML MISC": 5 refills | Status: DC

## 2018-04-15 MED ORDER — TESTOSTERONE CYPIONATE 100 MG/ML IM SOLN: 200.0000 mg | INTRAMUSCULAR | 3 refills | Status: DC

## 2018-04-15 NOTE — Progress Notes (Signed)
[facility] [place], [place] [address]  Internal MEDICINE  Office Visit Note  Patient Name: Stephen Lowery  [id number]  [id number]  Date of Service: 04/15/2018  Chief Complaint  Patient presents with  . Medical Management of Chronic Issues    follow up medication refills  . Quality Metric Gaps    pt has not gotten the flu vaccine    The patient is here for routine follow up visit. Currently on testosterone injections 200mg  every 2 weeks. Last check of testosterone levels done per endocrinology. Levels were still very low despite testosterone injections every other week. He is due to have this and routine, fasting labs checked. He does not plan to get a flu shot .      Current Medication: Outpatient Encounter Medications as of 04/15/2018  Medication Sig  . albuterol (PROVENTIL HFA;VENTOLIN HFA) 108 (90 Base) MCG/ACT inhaler Inhale 2 puffs into the lungs every 6 (six) hours as needed for wheezing or shortness of breath.  . B-D INTEGRA SYRINGE 22G X 1-1/2" 3 ML MISC To use for testosterone injections.  . busPIRone (BUSPAR) 10 MG tablet Take 10 mg by mouth daily.  . diazepam (VALIUM) 5 MG tablet Take 1 tablet PO 1 hour prior to procedure. May repeat dose 15 minutes prior to procedure if needed.  Marland Kitchen FLUoxetine (PROZAC) 10 MG capsule Take 1 capsule (10 mg total) by mouth daily.  Marland Kitchen losartan-hydrochlorothiazide (HYZAAR) 100-12.5 MG tablet Take 1 tablet by mouth daily.  . minocycline (MINOCIN,DYNACIN) 100 MG capsule TK 1 C PO D FOR ACNE  . omeprazole (PRILOSEC) 40 MG capsule TK 1 C PO QD 30 MIN B THE EVE MEAL  . rosuvastatin (CRESTOR) 10 MG tablet Take 1 tablet (10 mg total) by mouth daily.  Marland Kitchen testosterone cypionate (DEPO-TESTOSTERONE) 100 MG/ML injection Inject 2 mLs (200 mg total) into the muscle every 14 (fourteen) days. For IM use only  . [DISCONTINUED] B-D INTEGRA SYRINGE 22G X 1-1/2" 3 ML MISC To use for testosterone injections.  . [DISCONTINUED] testosterone  cypionate (DEPO-TESTOSTERONE) 100 MG/ML injection Inject 2 mLs (200 mg total) into the muscle every 14 (fourteen) days. For IM use only   No facility-administered encounter medications on file as of 04/15/2018.     Surgical History: Past Surgical History:  Procedure Laterality Date  . right hip replacement Right 06/17/2014    Medical History: Past Medical History:  Diagnosis Date  . Anxiety   . Hyperlipidemia     Family History: Family History  Problem Relation Age of Onset  . Hypertension Mother   . Hyperlipidemia Father   . Hypertension Father     Social History   Socioeconomic History  . Marital status: Married    Spouse name: Not on file  . Number of children: Not on file  . Years of education: Not on file  . Highest education level: Not on file  Occupational History  . Not on file  Social Needs  . Financial resource strain: Not on file  . Food insecurity:    Worry: Not on file    Inability: Not on file  . Transportation needs:    Medical: Not on file    Non-medical: Not on file  Tobacco Use  . Smoking status: Current Every Day Smoker    Types: Cigarettes    Start date: 09/03/2017  . Smokeless tobacco: Never Used  Substance and Sexual Activity  . Alcohol use: Yes    Frequency: Never    Comment:  social  . Drug use: No  . Sexual activity: Not on file  Lifestyle  . Physical activity:    Days per week: Not on file    Minutes per session: Not on file  . Stress: Not on file  Relationships  . Social connections:    Talks on phone: Not on file    Gets together: Not on file    Attends religious service: Not on file    Active member of club or organization: Not on file    Attends meetings of clubs or organizations: Not on file    Relationship status: Not on file  . Intimate partner violence:    Fear of current or ex partner: Not on file    Emotionally abused: Not on file    Physically abused: Not on file    Forced sexual activity: Not on file  Other  Topics Concern  . Not on file  Social History Narrative  . Not on file      Review of Systems  Constitutional: Negative for activity change, fatigue and unexpected weight change.  HENT: Negative for ear pain.   Respiratory: Negative for chest tightness, shortness of breath and wheezing.   Cardiovascular: Negative for chest pain and palpitations.       Blood pressure elevated today. Has not taken regular medications.   Gastrointestinal: Negative for abdominal pain, diarrhea, nausea and vomiting.  Endocrine: Negative for cold intolerance, heat intolerance, polydipsia and polyuria.       History of testosterone hypofunction and pituitary adenoma. Sees endocrinologist at [facility] for monitoring.   Genitourinary: Negative for dysuria, frequency and hematuria.  Musculoskeletal: Negative for arthralgias and back pain.  Skin:       Increased acne on his back.   Allergic/Immunologic: Negative for environmental allergies.  Neurological: Positive for headaches. Negative for weakness.  Hematological: Negative for adenopathy.  Psychiatric/Behavioral: Positive for agitation and dysphoric mood. Negative for decreased concentration. The patient is not nervous/anxious.        Taking buspirone every once in a while, when needed for acute anxiety and irritability. No longer taking fluoxetine.    Today's Vitals   04/15/18 0902  BP: (!) 147/91  Pulse: 72  Resp: 16  SpO2: 98%  Weight: 267 lb 12.8 oz (121.5 kg)  Height: 6\' 2"  (1.88 m)    Physical Exam Vitals signs and nursing note reviewed.  Constitutional:      General: He is not in acute distress.    Appearance: Normal appearance. He is well-developed. He is not diaphoretic.  HENT:     Head: Normocephalic and atraumatic.     Mouth/Throat:     Pharynx: No oropharyngeal exudate.  Eyes:     Pupils: Pupils are equal, round, and reactive to light.  Neck:     Musculoskeletal: Normal range of motion and neck supple.     Thyroid: No thyromegaly.      Vascular: No carotid bruit or JVD.     Trachea: No tracheal deviation.  Cardiovascular:     Rate and Rhythm: Normal rate and regular rhythm.     Heart sounds: Normal heart sounds. No murmur. No friction rub. No gallop.   Pulmonary:     Effort: Pulmonary effort is normal. No respiratory distress.     Breath sounds: Normal breath sounds. No wheezing or rales.  Chest:     Chest wall: No tenderness.  Abdominal:     General: Bowel sounds are normal.     Palpations: Abdomen is  soft.     Tenderness: There is no abdominal tenderness.  Musculoskeletal: Normal range of motion.  Lymphadenopathy:     Cervical: No cervical adenopathy.  Skin:    General: Skin is warm and dry.  Neurological:     Mental Status: He is alert and oriented to person, place, and time.     Cranial Nerves: No cranial nerve deficit.  Psychiatric:        Behavior: Behavior normal.        Thought Content: Thought content normal.        Judgment: Judgment normal.    Assessment/Plan: 1. Essential hypertension Generally stable. Continue bp medication as prescribed  - CBC with Differential/Platelet - Comprehensive metabolic panel - Lipid panel  2. Hyperlipidemia, unspecified hyperlipidemia type Check fasting lipid panel and adjust crestor as indicated  - CBC with Differential/Platelet - Comprehensive metabolic panel - Lipid panel  3. Testicular hypofunction Continue testosterone injections, 200mg  every other week. Check testosterone, PSA, lipid panel, and prolactin level. Will need to see endocrinologist for abnormal levels.  - testosterone cypionate (DEPO-TESTOSTERONE) 100 MG/ML injection; Inject 2 mLs (200 mg total) into the muscle every 14 (fourteen) days. For IM use only  Dispense: 10 mL; Refill: 3 - B-D INTEGRA SYRINGE 22G X 1-1/2" 3 ML MISC; To use for testosterone injections.  Dispense: 5 each; Refill: 5 - TSH - Testosterone - Prolactin  4. GAD (generalized anxiety disorder) May continue to take buspirone  as needed and as prescribed.   5. Pituitary microadenoma [facility]) Recent MRI done at [facility] shows stable lesion. Check labs and follow up with endocrinologist/neurosurgery for continued management.  - TSH - Testosterone - Prolactin  6. Screening for prostate cancer - PSA  General Counseling: Thaine verbalizes understanding of the findings of todays visit and agrees with plan of treatment. I have discussed any further diagnostic evaluation that may be needed or ordered today. We also reviewed his medications today. he has been encouraged to call the office with any questions or concerns that should arise related to todays visit.  Hypertension Counseling:   The following hypertensive lifestyle modification were recommended and discussed:  1. Limiting alcohol intake to less than 1 oz/day of ethanol:(24 oz of beer or 8 oz of wine or 2 oz of 100-proof whiskey). 2. Take baby ASA 81 mg daily. 3. Importance of regular aerobic exercise and losing weight. 4. Reduce dietary saturated fat and cholesterol intake for overall cardiovascular health. 5. Maintaining adequate dietary potassium, calcium, and magnesium intake. 6. Regular monitoring of the blood pressure. 7. Reduce sodium intake to less than 100 mmol/day (less than 2.3 gm of sodium or less than 6 gm of sodium choride)   This patient was seen by [place] with Dr Lavera Guise as a part of collaborative care agreement  Orders Placed This Encounter  Procedures  . PSA  . CBC with Differential/Platelet  . Comprehensive metabolic panel  . Lipid panel  . TSH  . Testosterone  . Prolactin    Meds ordered this encounter  Medications  . testosterone cypionate (DEPO-TESTOSTERONE) 100 MG/ML injection    Sig: Inject 2 mLs (200 mg total) into the muscle every 14 (fourteen) days. For IM use only    Dispense:  10 mL    Refill:  3    Order Specific Question:   Supervising Provider    Answer:   Lavera Guise [[id number]]  . B-D INTEGRA  SYRINGE 22G X 1-1/2" 3 ML MISC  Sig: To use for testosterone injections.    Dispense:  5 each    Refill:  5    Order Specific Question:   Supervising Provider    Answer:   Lavera Guise [[id number]]    Time spent: 61 Minutes      Dr Lavera Guise Internal medicine

## 2018-04-28 DIAGNOSIS — G4733 Obstructive sleep apnea (adult) (pediatric): Secondary | ICD-10-CM | POA: Diagnosis not present

## 2018-05-08 ENCOUNTER — Other Ambulatory Visit: Payer: Self-pay

## 2018-05-08 DIAGNOSIS — E291 Testicular hypofunction: Secondary | ICD-10-CM

## 2018-05-08 MED ORDER — "BD INTEGRA SYRINGE 22G X 1-1/2"" 3 ML MISC": 5 refills | Status: DC

## 2018-05-19 ENCOUNTER — Other Ambulatory Visit: Payer: Self-pay

## 2018-05-19 DIAGNOSIS — E291 Testicular hypofunction: Secondary | ICD-10-CM

## 2018-05-19 MED ORDER — "BD INTEGRA SYRINGE 22G X 1-1/2"" 3 ML MISC": 5 refills | Status: DC

## 2018-05-26 DIAGNOSIS — J069 Acute upper respiratory infection, unspecified: Secondary | ICD-10-CM | POA: Diagnosis not present

## 2018-05-27 DIAGNOSIS — G4733 Obstructive sleep apnea (adult) (pediatric): Secondary | ICD-10-CM | POA: Diagnosis not present

## 2018-06-03 ENCOUNTER — Other Ambulatory Visit: Payer: Self-pay

## 2018-06-03 MED ORDER — MINOCYCLINE HCL 100 MG PO CAPS: ORAL_CAPSULE | ORAL | 2 refills | Status: DC

## 2018-06-13 DIAGNOSIS — G4733 Obstructive sleep apnea (adult) (pediatric): Secondary | ICD-10-CM | POA: Diagnosis not present

## 2018-06-27 DIAGNOSIS — G4733 Obstructive sleep apnea (adult) (pediatric): Secondary | ICD-10-CM | POA: Diagnosis not present

## 2018-07-17 ENCOUNTER — Ambulatory Visit: Payer: Self-pay | Admitting: Nurse Practitioner

## 2018-07-27 DIAGNOSIS — G4733 Obstructive sleep apnea (adult) (pediatric): Secondary | ICD-10-CM | POA: Diagnosis not present

## 2018-08-01 ENCOUNTER — Ambulatory Visit: Payer: BC Managed Care – PPO | Admitting: Nurse Practitioner

## 2018-08-01 ENCOUNTER — Other Ambulatory Visit: Payer: Self-pay

## 2018-08-01 DIAGNOSIS — E291 Testicular hypofunction: Secondary | ICD-10-CM

## 2018-08-01 DIAGNOSIS — I10 Essential (primary) hypertension: Secondary | ICD-10-CM

## 2018-08-01 DIAGNOSIS — E785 Hyperlipidemia, unspecified: Secondary | ICD-10-CM

## 2018-08-01 MED ORDER — TESTOSTERONE CYPIONATE 100 MG/ML IM SOLN: 200.0000 mg | INTRAMUSCULAR | 3 refills | Status: DC

## 2018-08-01 MED ORDER — ROSUVASTATIN CALCIUM 10 MG PO TABS: 10.0000 mg | ORAL_TABLET | Freq: Every day | ORAL | 5 refills | Status: DC

## 2018-08-01 NOTE — Progress Notes (Signed)
[facility] [place], [place] [address]  Internal MEDICINE  Telephone Visit  Patient Name: Stephen Lowery  [id number]  [id number]  Date of Service: 08/20/2018  I connected with the patient at 9:56am by webcam and verified the patients identity using two identifiers.   I discussed the limitations, risks, security and privacy concerns of performing an evaluation and management service by webcam and the availability of in person appointments. I also discussed with the patient that there may be a patient responsible charge related to the service.  The patient expressed understanding and agrees to proceed.    Chief Complaint  Patient presents with  . Telephone Screen    VIDEO VISIT  . Telephone Assessment  . Medical Management of Chronic Issues    3 month follow up medication refills    The patient has been contacted via webcam for follow up visit due to concerns for spread of novel coronavirus. The patient states that he is doing well. Needs to have refills of routine medication. Spcifically, he takes testosterone injections 200mg  every 2 weeks. Tolerating them well. He is due to have levels checked. In fact, they were ordered after his last visit. Has not had them drawn due to fear of COVID 19.       Current Medication: Outpatient Encounter Medications as of 08/01/2018  Medication Sig  . albuterol (PROVENTIL HFA;VENTOLIN HFA) 108 (90 Base) MCG/ACT inhaler Inhale 2 puffs into the lungs every 6 (six) hours as needed for wheezing or shortness of breath.  . B-D INTEGRA SYRINGE 22G X 1-1/2" 3 ML MISC To use for testosterone injections.  . busPIRone (BUSPAR) 10 MG tablet Take 10 mg by mouth daily.  Marland Kitchen losartan-hydrochlorothiazide (HYZAAR) 100-12.5 MG tablet Take 1 tablet by mouth daily.  . minocycline (MINOCIN,DYNACIN) 100 MG capsule TK 1 C PO D FOR ACNE  . rosuvastatin (CRESTOR) 10 MG tablet Take 1 tablet (10 mg total) by mouth daily.  Marland Kitchen testosterone cypionate  (DEPO-TESTOSTERONE) 100 MG/ML injection Inject 2 mLs (200 mg total) into the muscle every 14 (fourteen) days.  . [DISCONTINUED] rosuvastatin (CRESTOR) 10 MG tablet Take 1 tablet (10 mg total) by mouth daily.  . [DISCONTINUED] testosterone cypionate (DEPO-TESTOSTERONE) 100 MG/ML injection Inject 2 mLs (200 mg total) into the muscle every 14 (fourteen) days. For IM use only  . diazepam (VALIUM) 5 MG tablet Take 1 tablet PO 1 hour prior to procedure. May repeat dose 15 minutes prior to procedure if needed. (Patient not taking: Reported on 08/01/2018)  . FLUoxetine (PROZAC) 10 MG capsule Take 1 capsule (10 mg total) by mouth daily. (Patient not taking: Reported on 08/01/2018)  . omeprazole (PRILOSEC) 40 MG capsule TK 1 C PO QD 30 MIN B THE EVE MEAL   No facility-administered encounter medications on file as of 08/01/2018.     Surgical History: Past Surgical History:  Procedure Laterality Date  . right hip replacement Right 06/17/2014    Medical History: Past Medical History:  Diagnosis Date  . Anxiety   . Hyperlipidemia     Family History: Family History  Problem Relation Age of Onset  . Hypertension Mother   . Hyperlipidemia Father   . Hypertension Father     Social History   Socioeconomic History  . Marital status: Married    Spouse name: Not on file  . Number of children: Not on file  . Years of education: Not on file  . Highest education level: Not on file  Occupational History  .  Not on file  Social Needs  . Financial resource strain: Not on file  . Food insecurity:    Worry: Not on file    Inability: Not on file  . Transportation needs:    Medical: Not on file    Non-medical: Not on file  Tobacco Use  . Smoking status: Current Every Day Smoker    Types: Cigarettes    Start date: 09/03/2017  . Smokeless tobacco: Never Used  Substance and Sexual Activity  . Alcohol use: Yes    Frequency: Never    Comment: social  . Drug use: No  . Sexual activity: Not on file   Lifestyle  . Physical activity:    Days per week: Not on file    Minutes per session: Not on file  . Stress: Not on file  Relationships  . Social connections:    Talks on phone: Not on file    Gets together: Not on file    Attends religious service: Not on file    Active member of club or organization: Not on file    Attends meetings of clubs or organizations: Not on file    Relationship status: Not on file  . Intimate partner violence:    Fear of current or ex partner: Not on file    Emotionally abused: Not on file    Physically abused: Not on file    Forced sexual activity: Not on file  Other Topics Concern  . Not on file  Social History Narrative  . Not on file      Review of Systems  Constitutional: Negative for activity change, fatigue and unexpected weight change.  HENT: Negative for ear pain.   Respiratory: Negative for chest tightness, shortness of breath and wheezing.   Cardiovascular: Negative for chest pain and palpitations.  Gastrointestinal: Negative for abdominal pain, diarrhea, nausea and vomiting.  Endocrine: Negative for cold intolerance, heat intolerance, polydipsia and polyuria.       History of testosterone hypofunction and pituitary adenoma. Sees endocrinologist at [facility] for monitoring.   Musculoskeletal: Negative for arthralgias and back pain.  Skin:       Increased acne on his back.   Allergic/Immunologic: Negative for environmental allergies.  Neurological: Positive for headaches. Negative for weakness.  Hematological: Negative for adenopathy.  Psychiatric/Behavioral: Positive for agitation. Negative for decreased concentration and dysphoric mood. The patient is not nervous/anxious.        Taking buspirone every once in a while, when needed for acute anxiety and irritability. No longer taking fluoxetine.     Vital Signs: There were no vitals taken for this visit.   Observation/Objective:  The patient is alert and oriented. He is pleasant and  answering all questions appropriately. Breathing is non-labored. He is in no acute distress.    Assessment/Plan:  1. Essential hypertension Generally stable. Continue BP medication as prescribed   2. Hyperlipidemia, unspecified hyperlipidemia type - rosuvastatin (CRESTOR) 10 MG tablet; Take 1 tablet (10 mg total) by mouth daily.  Dispense: 30 tablet; Refill: 5  3. Testicular hypofunction Continue current dose testosterone injections. Will adjust dosing after new levels are obtained.  - testosterone cypionate (DEPO-TESTOSTERONE) 100 MG/ML injection; Inject 2 mLs (200 mg total) into the muscle every 14 (fourteen) days.  Dispense: 10 mL; Refill: 3  General Counseling: Trusten verbalizes understanding of the findings of today's phone visit and agrees with plan of treatment. I have discussed any further diagnostic evaluation that may be needed or ordered today. We also reviewed  his medications today. he has been encouraged to call the office with any questions or concerns that should arise related to todays visit.  Hypertension Counseling:   The following hypertensive lifestyle modification were recommended and discussed:  1. Limiting alcohol intake to less than 1 oz/day of ethanol:(24 oz of beer or 8 oz of wine or 2 oz of 100-proof whiskey). 2. Take baby ASA 81 mg daily. 3. Importance of regular aerobic exercise and losing weight. 4. Reduce dietary saturated fat and cholesterol intake for overall cardiovascular health. 5. Maintaining adequate dietary potassium, calcium, and magnesium intake. 6. Regular monitoring of the blood pressure. 7. Reduce sodium intake to less than 100 mmol/day (less than 2.3 gm of sodium or less than 6 gm of sodium choride)   This patient was seen by [facility] with Dr Lavera Guise as a part of collaborative care agreement  Meds ordered this encounter  Medications  . testosterone cypionate (DEPO-TESTOSTERONE) 100 MG/ML injection    Sig: Inject 2  mLs (200 mg total) into the muscle every 14 (fourteen) days.    Dispense:  10 mL    Refill:  3    Order Specific Question:   Supervising Provider    Answer:   Lavera Guise [[id number]]  . rosuvastatin (CRESTOR) 10 MG tablet    Sig: Take 1 tablet (10 mg total) by mouth daily.    Dispense:  30 tablet    Refill:  5    Order Specific Question:   Supervising Provider    Answer:   Lavera Guise [[id number]]    Time spent: 54 Minutes    Dr Lavera Guise Internal medicine

## 2018-08-20 ENCOUNTER — Encounter: Payer: Self-pay | Admitting: Nurse Practitioner

## 2018-08-27 DIAGNOSIS — G4733 Obstructive sleep apnea (adult) (pediatric): Secondary | ICD-10-CM | POA: Diagnosis not present

## 2018-09-25 ENCOUNTER — Other Ambulatory Visit: Payer: Self-pay

## 2018-09-25 MED ORDER — MINOCYCLINE HCL 100 MG PO CAPS: ORAL_CAPSULE | ORAL | 2 refills | Status: DC

## 2018-11-06 ENCOUNTER — Other Ambulatory Visit: Payer: Self-pay

## 2018-11-06 ENCOUNTER — Encounter: Payer: Self-pay | Admitting: Nurse Practitioner

## 2018-11-06 ENCOUNTER — Ambulatory Visit: Payer: BC Managed Care – PPO | Admitting: Nurse Practitioner

## 2018-11-06 VITALS — BP 140/93 | HR 67 | Temp 97.6°F | Resp 16 | Ht 74.0 in | Wt 263.0 lb

## 2018-11-06 DIAGNOSIS — D352 Benign neoplasm of pituitary gland: Secondary | ICD-10-CM | POA: Diagnosis not present

## 2018-11-06 DIAGNOSIS — E291 Testicular hypofunction: Secondary | ICD-10-CM | POA: Diagnosis not present

## 2018-11-06 DIAGNOSIS — I10 Essential (primary) hypertension: Secondary | ICD-10-CM

## 2018-11-06 DIAGNOSIS — F17210 Nicotine dependence, cigarettes, uncomplicated: Secondary | ICD-10-CM | POA: Insufficient documentation

## 2018-11-06 MED ORDER — TESTOSTERONE CYPIONATE 100 MG/ML IM SOLN: 200.0000 mg | INTRAMUSCULAR | 3 refills | Status: DC

## 2018-11-06 NOTE — Progress Notes (Signed)
[facility] [place], [place] [address]  Internal MEDICINE  Office Visit Note  Patient Name: Stephen Lowery  [id number]  [id number]  Date of Service: 11/06/2018  Chief Complaint  Patient presents with  . Follow-up  . Hypertension  . Hyperlipidemia  . Anxiety    The patient is here for routine follow up visit. Currently on testosterone injections 200mg  every 2 weeks. Last check of testosterone levels done still very low despite treatment with testosterone injections. He will be seeing a new endocrinologist at [facility] to help with treatment of low testosterone levels and pituitary microadenoma. He had labs ordered to check testosterone and prolactin levels prior to this visit. Had trouble getting this done due to spread of COVID 19. new endocrinologist will be having same labs checked prior to his next visit with me. Patient states that he feels pretty good. Has no concerns or complaints today. States that he is trying to quit smoking again. Using nicotine pouches to help curb cravings        Current Medication: Outpatient Encounter Medications as of 11/06/2018  Medication Sig  . albuterol (PROVENTIL HFA;VENTOLIN HFA) 108 (90 Base) MCG/ACT inhaler Inhale 2 puffs into the lungs every 6 (six) hours as needed for wheezing or shortness of breath.  . B-D INTEGRA SYRINGE 22G X 1-1/2" 3 ML MISC To use for testosterone injections.  . busPIRone (BUSPAR) 10 MG tablet Take 10 mg by mouth daily.  Marland Kitchen losartan-hydrochlorothiazide (HYZAAR) 100-12.5 MG tablet Take 1 tablet by mouth daily.  . minocycline (MINOCIN) 100 MG capsule TK 1 C PO D FOR ACNE  . omeprazole (PRILOSEC) 40 MG capsule TK 1 C PO QD 30 MIN B THE EVE MEAL  . rosuvastatin (CRESTOR) 10 MG tablet Take 1 tablet (10 mg total) by mouth daily.  Marland Kitchen testosterone cypionate (DEPO-TESTOSTERONE) 100 MG/ML injection Inject 2 mLs (200 mg total) into the muscle every 14 (fourteen) days.  . [DISCONTINUED] testosterone cypionate  (DEPO-TESTOSTERONE) 100 MG/ML injection Inject 2 mLs (200 mg total) into the muscle every 14 (fourteen) days.  . [DISCONTINUED] diazepam (VALIUM) 5 MG tablet Take 1 tablet PO 1 hour prior to procedure. May repeat dose 15 minutes prior to procedure if needed. (Patient not taking: Reported on 08/01/2018)  . [DISCONTINUED] FLUoxetine (PROZAC) 10 MG capsule Take 1 capsule (10 mg total) by mouth daily. (Patient not taking: Reported on 08/01/2018)   No facility-administered encounter medications on file as of 11/06/2018.     Surgical History: Past Surgical History:  Procedure Laterality Date  . right hip replacement Right 06/17/2014    Medical History: Past Medical History:  Diagnosis Date  . Anxiety   . Hyperlipidemia     Family History: Family History  Problem Relation Age of Onset  . Hypertension Mother   . Hyperlipidemia Father   . Hypertension Father     Social History   Socioeconomic History  . Marital status: Married    Spouse name: Not on file  . Number of children: Not on file  . Years of education: Not on file  . Highest education level: Not on file  Occupational History  . Not on file  Social Needs  . Financial resource strain: Not on file  . Food insecurity    Worry: Not on file    Inability: Not on file  . Transportation needs    Medical: Not on file    Non-medical: Not on file  Tobacco Use  . Smoking status: Current Some Day  Smoker    Types: Cigarettes    Start date: 09/03/2017  . Smokeless tobacco: Current User  Substance and Sexual Activity  . Alcohol use: Yes    Frequency: Never    Comment: social  . Drug use: No  . Sexual activity: Not on file  Lifestyle  . Physical activity    Days per week: Not on file    Minutes per session: Not on file  . Stress: Not on file  Relationships  . Social Herbalist on phone: Not on file    Gets together: Not on file    Attends religious service: Not on file    Active member of club or organization: Not  on file    Attends meetings of clubs or organizations: Not on file    Relationship status: Not on file  . Intimate partner violence    Fear of current or ex partner: Not on file    Emotionally abused: Not on file    Physically abused: Not on file    Forced sexual activity: Not on file  Other Topics Concern  . Not on file  Social History Narrative  . Not on file      Review of Systems  Constitutional: Negative for activity change, fatigue and unexpected weight change.  HENT: Negative for ear pain.   Respiratory: Negative for chest tightness, shortness of breath and wheezing.   Cardiovascular: Negative for chest pain and palpitations.  Gastrointestinal: Negative for abdominal pain, diarrhea, nausea and vomiting.  Endocrine: Negative for cold intolerance, heat intolerance, polydipsia and polyuria.       History of testosterone hypofunction and pituitary adenoma. Sees endocrinologist at [facility] for monitoring.   Musculoskeletal: Negative for arthralgias and back pain.  Skin:       Increased acne on his back.   Allergic/Immunologic: Negative for environmental allergies.  Neurological: Positive for headaches. Negative for weakness.  Hematological: Negative for adenopathy.  Psychiatric/Behavioral: Negative for agitation, decreased concentration and dysphoric mood. The patient is not nervous/anxious.        Taking buspirone every once in a while, when needed for acute anxiety and irritability. No longer taking fluoxetine.    Today's Vitals   11/06/18 0954  BP: (!) 140/93  Pulse: 67  Resp: 16  Temp: 97.6 F (36.4 C)  SpO2: 98%  Weight: 263 lb (119.3 kg)  Height: 6\' 2"  (1.88 m)   Body mass index is 33.77 kg/m.  Physical Exam Vitals signs and nursing note reviewed.  Constitutional:      General: He is not in acute distress.    Appearance: Normal appearance. He is well-developed. He is not diaphoretic.  HENT:     Head: Normocephalic and atraumatic.     Mouth/Throat:      Pharynx: No oropharyngeal exudate.  Eyes:     Pupils: Pupils are equal, round, and reactive to light.  Neck:     Musculoskeletal: Normal range of motion and neck supple.     Thyroid: No thyromegaly.     Vascular: No carotid bruit or JVD.     Trachea: No tracheal deviation.  Cardiovascular:     Rate and Rhythm: Normal rate and regular rhythm.     Heart sounds: Normal heart sounds. No murmur. No friction rub. No gallop.   Pulmonary:     Effort: Pulmonary effort is normal. No respiratory distress.     Breath sounds: Normal breath sounds. No wheezing or rales.  Chest:     Chest  wall: No tenderness.  Abdominal:     General: Bowel sounds are normal.     Palpations: Abdomen is soft.     Tenderness: There is no abdominal tenderness.  Musculoskeletal: Normal range of motion.  Lymphadenopathy:     Cervical: No cervical adenopathy.  Skin:    General: Skin is warm and dry.  Neurological:     Mental Status: He is alert and oriented to person, place, and time.     Cranial Nerves: No cranial nerve deficit.  Psychiatric:        Behavior: Behavior normal.        Thought Content: Thought content normal.        Judgment: Judgment normal.   Assessment/Plan: 1. Essential hypertension Generally stable. Continue bp medication as prescribed   2. Testicular hypofunction Continue testosterone injections as prescribed. Appointment with new endocrinologist as scheduled. Labs to be done prior to next visit, either at [facility] or with new endocrinologist. Review at next visit.  - testosterone cypionate (DEPO-TESTOSTERONE) 100 MG/ML injection; Inject 2 mLs (200 mg total) into the muscle every 14 (fourteen) days.  Dispense: 10 mL; Refill: 3  3. Pituitary microadenoma [facility]) Appointment with new endocrinologist as scheduled.  4. Cigarette smoker Patient using nicotine pouches to help with smoking cessation.   General Counseling: Kiven verbalizes understanding of the findings of todays visit and agrees  with plan of treatment. I have discussed any further diagnostic evaluation that may be needed or ordered today. We also reviewed his medications today. he has been encouraged to call the office with any questions or concerns that should arise related to todays visit.   Hypertension Counseling:  Smoking cessation counseling: 1. Pt acknowledges the risks of long term smoking, she will try to quite smoking. 2. Options for different medications including nicotine products, chewing gum, patch etc, Wellbutrin and Chantix is discussed 3. Goal and date of compete cessation is discussed 4. Total time spent in smoking cessation is 10 min.  This patient was seen by [facility] with Dr Lavera Guise as a part of collaborative care agreement  Meds ordered this encounter  Medications  . testosterone cypionate (DEPO-TESTOSTERONE) 100 MG/ML injection    Sig: Inject 2 mLs (200 mg total) into the muscle every 14 (fourteen) days.    Dispense:  10 mL    Refill:  3    Order Specific Question:   Supervising Provider    Answer:   Lavera Guise [[id number]]    Time spent: 48 Minutes      Dr Lavera Guise Internal medicine

## 2018-12-17 ENCOUNTER — Other Ambulatory Visit: Payer: Self-pay

## 2018-12-17 MED ORDER — MINOCYCLINE HCL 100 MG PO CAPS: ORAL_CAPSULE | ORAL | 2 refills | Status: DC

## 2018-12-19 ENCOUNTER — Other Ambulatory Visit: Payer: Self-pay

## 2018-12-19 ENCOUNTER — Other Ambulatory Visit: Payer: Self-pay | Admitting: Nurse Practitioner

## 2018-12-19 DIAGNOSIS — E291 Testicular hypofunction: Secondary | ICD-10-CM

## 2018-12-19 MED ORDER — TESTOSTERONE CYPIONATE 100 MG/ML IM SOLN: 200.0000 mg | INTRAMUSCULAR | 3 refills | Status: DC

## 2018-12-19 NOTE — Progress Notes (Signed)
Refilled current prescription for testosterone injections every 14 days and sent to [facility] in [place].

## 2019-01-23 IMAGING — MR MR HEAD WO/W CM
19 series · 48 of 48 positions shown · IV contrast (10 ML MULTIHANCE)
Comparison: None.

CLINICAL DATA: Testicular hypofunction

EXAM:
MRI HEAD WITHOUT AND WITH CONTRAST
TECHNIQUE: Multiplanar, multiecho pulse sequences of the brain and surrounding
structures were obtained without and with intravenous contrast.
CONTRAST:  10mL MULTIHANCE GADOBENATE DIMEGLUMINE 529 MG/ML IV SOLN

[Series 2: T1 · sagittal · 5.0mm · 0.47mm/px · 1 of 23 slices shown (1 of 5)]
[im 1/23]
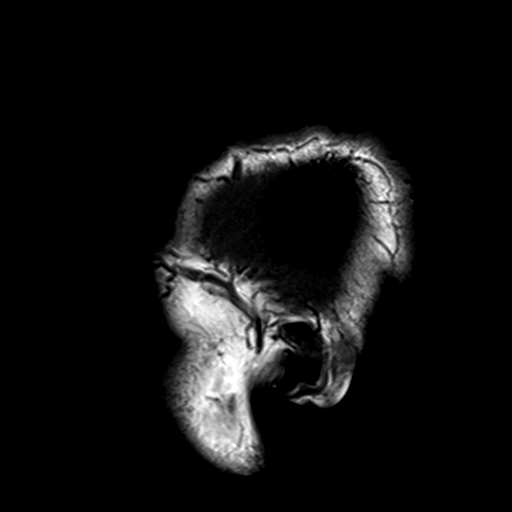

[Series 4: DWI · axial · 3.0mm · 1.20mm/px · z∈[-47,+126]mm · 5 of 59 slices shown (1 of 2)]
[im 1/59]
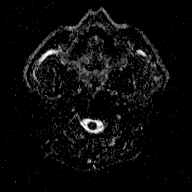
[im 15/59]
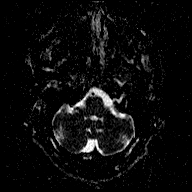
[im 30/59]
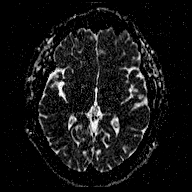
[im 44/59]
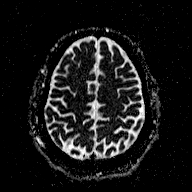
[im 59/59]
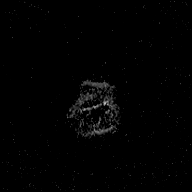

[Series 5: T2 · axial · 5.0mm · 0.72mm/px · z∈[-45,+122]mm · 2 of 25 slices shown (1 of 2)]
[im 1/25]
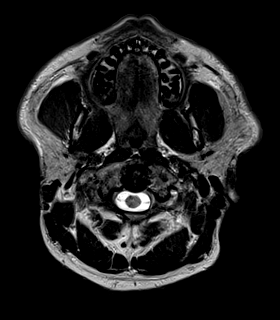
[im 25/25]
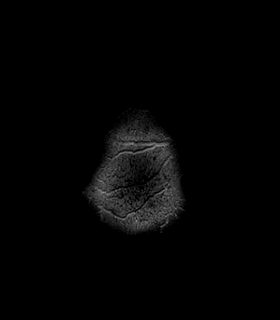

[Series 6: FLAIR · axial · 3.0mm · 0.45mm/px · z∈[-44,+121]mm · 5 of 56 slices shown]
[im 1/56]
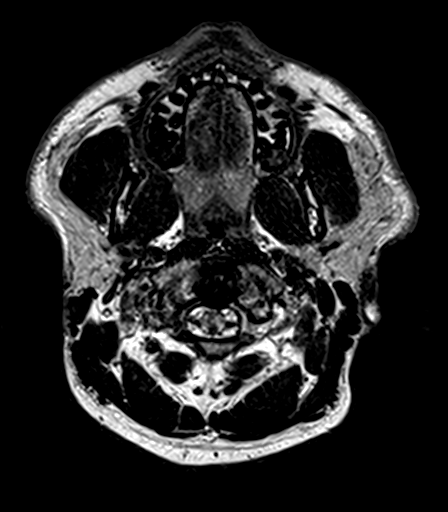
[im 14/56]
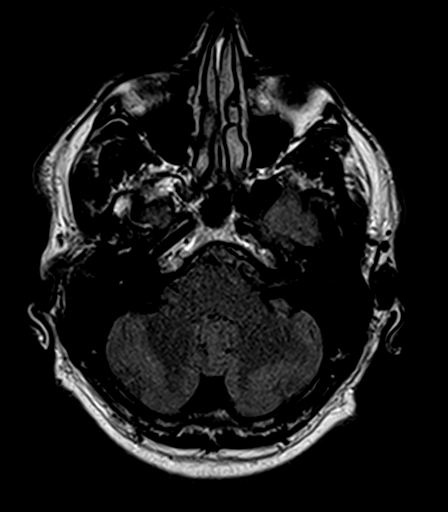
[im 28/56]
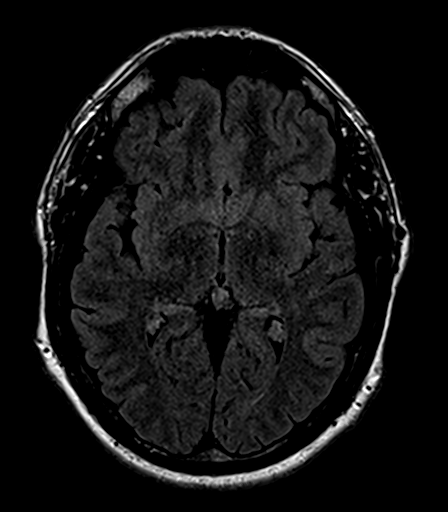
[im 42/56]
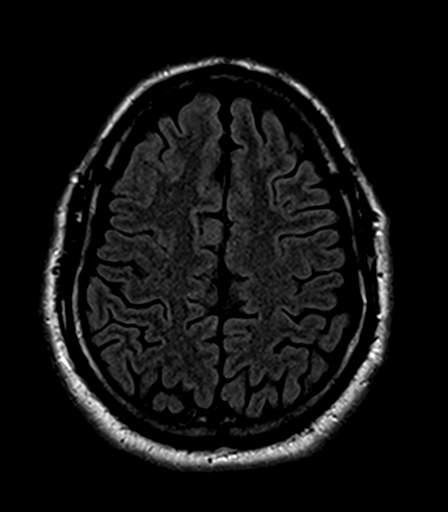
[im 56/56]
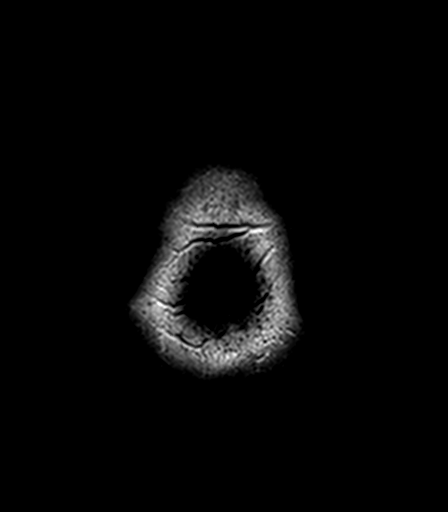

[Series 7: T2 · axial · 5.0mm · 0.72mm/px · z∈[-45,+122]mm · 2 of 25 slices shown (2 of 2)]
[im 1/25]
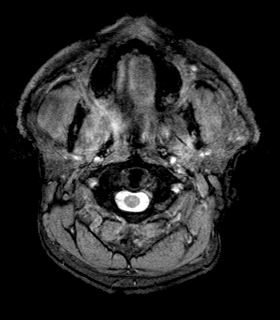
[im 25/25]
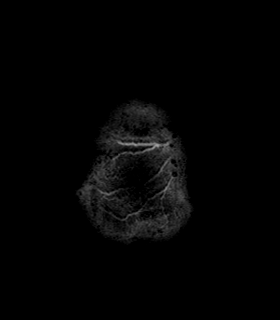

[Series 8: T1 · sagittal · 3.0mm · 0.53mm/px · 1 of 15 slices shown (2 of 5)]
[im 1/15]
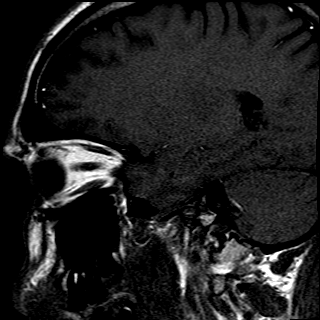

[Series 10: T1 · coronal · non-contrast · 3.0mm · 0.42mm/px · 1 of 10 slices shown (3 of 5)]
[im 1/10]
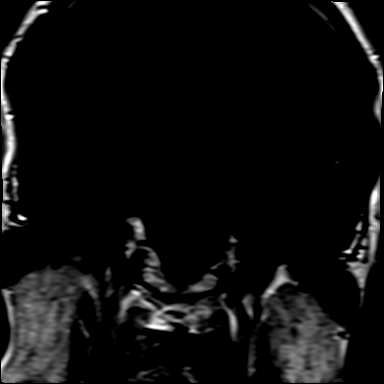

[Series 17: T1 post-contrast · sagittal · 3.0mm · 0.53mm/px · 1 of 15 slices shown (1 of 3)]
[im 1/15]
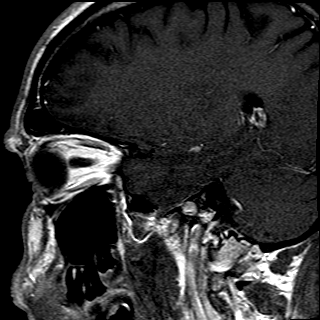

[Series 19: T1 post-contrast · coronal · 5.0mm · 0.45mm/px · 3 of 31 slices shown (2 of 3)]
[im 1/31]
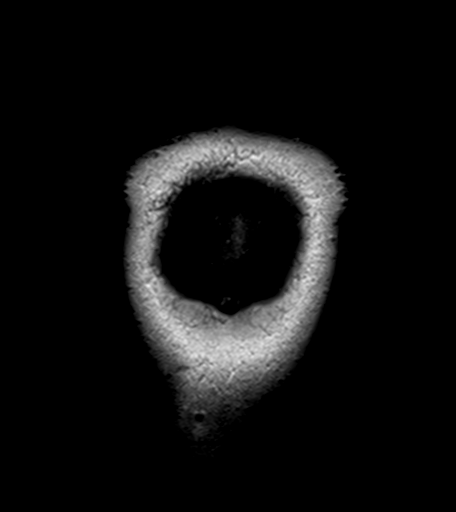
[im 16/31]
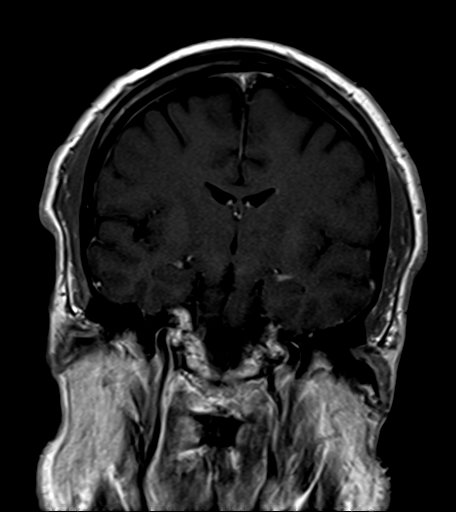
[im 31/31]
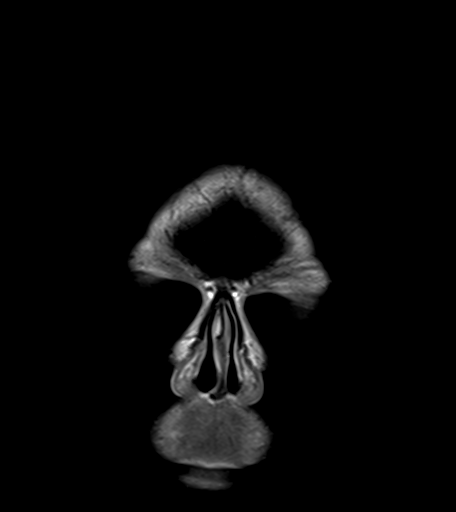

[Series 20: T1 · axial · 1.0mm · 1.00mm/px · z∈[-41,+117]mm · 14 of 160 slices shown (4 of 5)]
[im 1/160]
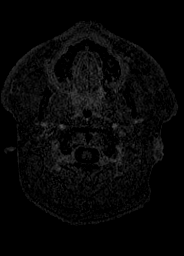
[im 13/160]
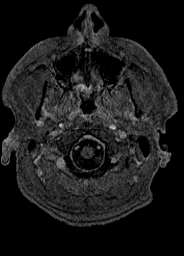
[im 25/160]
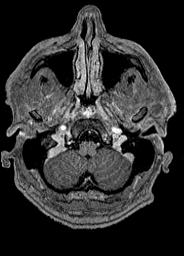
[im 37/160]
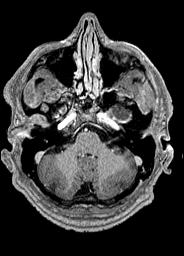
[im 49/160]
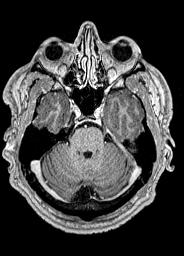
[im 62/160]
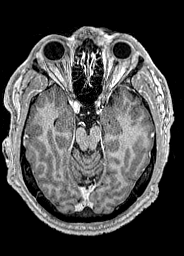
[im 74/160]
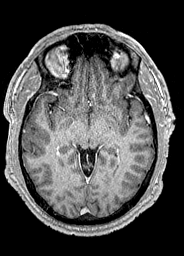
[im 86/160]
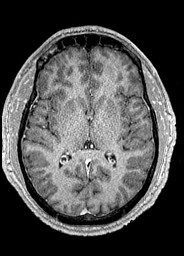
[im 98/160]
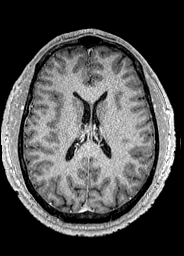
[im 111/160]
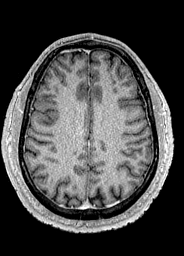
[im 123/160]
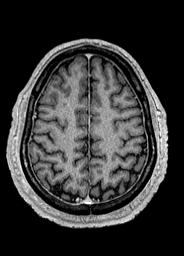
[im 135/160]
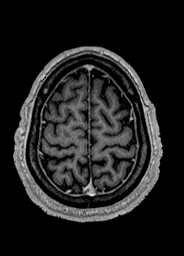
[im 147/160]
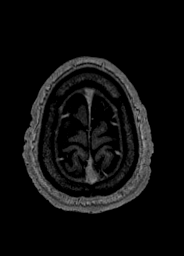
[im 160/160]
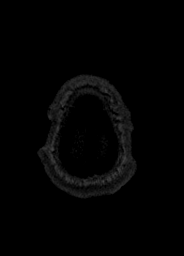

[Series 100: DWI · axial · 3.0mm · 1.20mm/px · z∈[-47,+126]mm · 5 of 59 slices shown (2 of 2)]
[im 1/59]
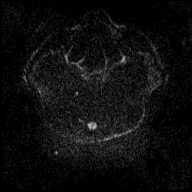
[im 15/59]
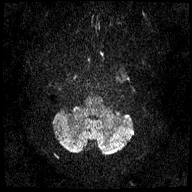
[im 30/59]
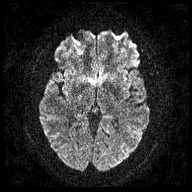
[im 44/59]
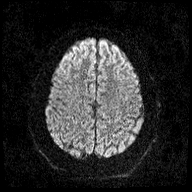
[im 59/59]
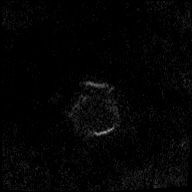

[Series 101: T1 · coronal · 3.0mm · 0.53mm/px · 1 of 15 slices shown (5 of 5)]
[im 1/15]
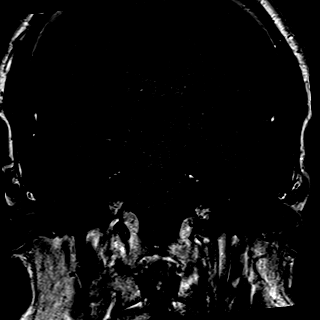

[Series 102: 1st pass · coronal · 3.0mm · 0.62mm/px · 1 of 9 slices shown]
[im 1/9]
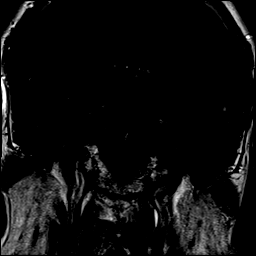

[Series 103: 2nd pass · coronal · 3.0mm · 0.62mm/px · 1 of 9 slices shown]
[im 1/9]
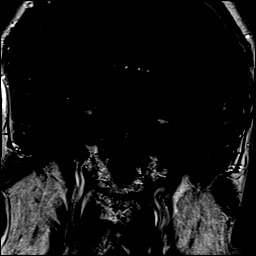

[Series 104: 3rd pass · coronal · 3.0mm · 0.62mm/px · 1 of 9 slices shown]
[im 1/9]
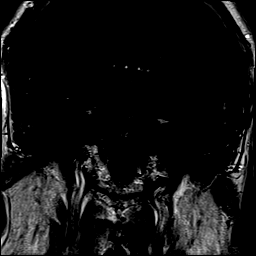

[Series 106: 4th pass · coronal · 3.0mm · 0.62mm/px · 1 of 9 slices shown]
[im 1/9]
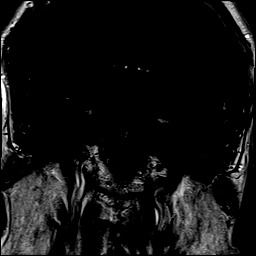

[Series 107: 5th pass · coronal · 3.0mm · 0.62mm/px · 1 of 9 slices shown]
[im 1/9]
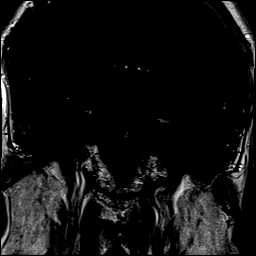

[Series 108: 6th pass · coronal · 3.0mm · 0.62mm/px · 1 of 9 slices shown]
[im 1/9]
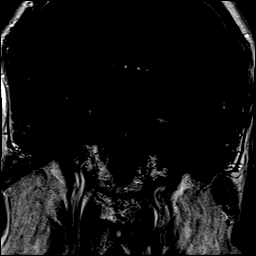

[Series 109: T1 post-contrast · coronal · 3.0mm · 0.44mm/px · 1 of 15 slices shown (3 of 3)]
[im 1/15]
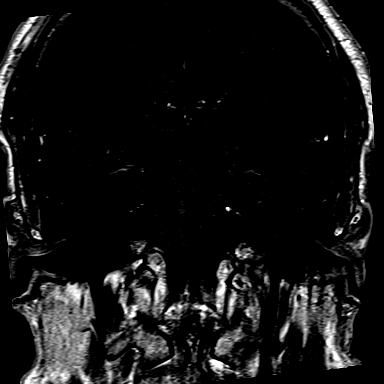

[48 of 48 positions shown; findings below may reference images not displayed]

FINDINGS: Brain: Dynamic pituitary protocol performed.

Pituitary not enlarged. Hypoenhancing ill-defined nodule in the
right posterior pituitary measuring 5 mm compatible with
microadenoma. Infundibulum midline normal. No compression optic
chiasm. Cavernous sinus negative.

Ventricle size normal.  Negative for infarct or hemorrhage.

Vascular: Normal arterial flow void

Skull and upper cervical spine: Negative

Sinuses/Orbits: Negative

Other: None
IMPRESSION: 5 mm ill-defined hypoenhancing nodule in the right posterior
pituitary compatible with microadenoma

## 2019-02-06 ENCOUNTER — Ambulatory Visit: Payer: BC Managed Care – PPO | Admitting: Nurse Practitioner

## 2019-02-06 DIAGNOSIS — Z6833 Body mass index (BMI) 33.0-33.9, adult: Secondary | ICD-10-CM | POA: Diagnosis not present

## 2019-02-06 DIAGNOSIS — E237 Disorder of pituitary gland, unspecified: Secondary | ICD-10-CM | POA: Diagnosis not present

## 2019-02-06 DIAGNOSIS — E230 Hypopituitarism: Secondary | ICD-10-CM | POA: Diagnosis not present

## 2019-02-09 DIAGNOSIS — E237 Disorder of pituitary gland, unspecified: Secondary | ICD-10-CM | POA: Insufficient documentation

## 2019-03-03 ENCOUNTER — Telehealth: Payer: Self-pay

## 2019-03-03 NOTE — Telephone Encounter (Signed)
CONFIRMED AND SCREENED FOR 03-05-19 OV.

## 2019-03-05 ENCOUNTER — Ambulatory Visit: Payer: BC Managed Care – PPO | Admitting: Nurse Practitioner

## 2019-03-09 ENCOUNTER — Encounter: Payer: Self-pay | Admitting: Nurse Practitioner

## 2019-03-09 ENCOUNTER — Other Ambulatory Visit: Payer: Self-pay

## 2019-03-09 ENCOUNTER — Ambulatory Visit: Payer: BC Managed Care – PPO | Admitting: Nurse Practitioner

## 2019-03-09 VITALS — BP 142/82 | HR 75 | Temp 97.5°F | Resp 16 | Ht 74.0 in | Wt 271.2 lb

## 2019-03-09 DIAGNOSIS — E291 Testicular hypofunction: Secondary | ICD-10-CM | POA: Diagnosis not present

## 2019-03-09 DIAGNOSIS — D352 Benign neoplasm of pituitary gland: Secondary | ICD-10-CM | POA: Diagnosis not present

## 2019-03-09 DIAGNOSIS — I10 Essential (primary) hypertension: Secondary | ICD-10-CM | POA: Diagnosis not present

## 2019-03-09 DIAGNOSIS — F411 Generalized anxiety disorder: Secondary | ICD-10-CM

## 2019-03-09 MED ORDER — TESTOSTERONE CYPIONATE 100 MG/ML IM SOLN: 300.0000 mg | INTRAMUSCULAR | 3 refills | Status: DC

## 2019-03-09 NOTE — Progress Notes (Signed)
[facility] [place], [place] [address]  Internal MEDICINE  Office Visit Note  Patient Name: Stephen Lowery  [id number]  [id number]  Date of Service: 03/09/2019  Chief Complaint  Patient presents with  . Hypertension  . Hyperlipidemia    The patient is here for routine follow up. He did have labs done through endocrinologist at [facility] in 01/2019. Testosterone levels still very low. He is taking testosterone injections 200mg  every two weeks. 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 Unsure if this is leading to resistent testosterone levels. Will increase testosterone for now and will have him consult with his endocrinologist for further evaluation.  Blood pressure is a bit elevated. He states that he is taking blood pressure medication as prescribed. Has made this part of his morning routine. Has been eating better and working out every morning.  He has no concerns or complaints today. Denies chest pain, chest pressure, or shortness of breath. Denies headaches or neurological symptoms.       Current Medication: Outpatient Encounter Medications as of 03/09/2019  Medication Sig  . albuterol (PROVENTIL HFA;VENTOLIN HFA) 108 (90 Base) MCG/ACT inhaler Inhale 2 puffs into the lungs every 6 (six) hours as needed for wheezing or shortness of breath.  . B-D INTEGRA SYRINGE 22G X 1-1/2" 3 ML MISC To use for testosterone injections.  . busPIRone (BUSPAR) 10 MG tablet Take 10 mg by mouth daily.  Marland Kitchen losartan-hydrochlorothiazide (HYZAAR) 100-12.5 MG tablet Take 1 tablet by mouth daily.  . minocycline (MINOCIN) 100 MG capsule TK 1 C PO D FOR ACNE  . omeprazole (PRILOSEC) 40 MG capsule TK 1 C PO QD 30 MIN B THE EVE MEAL  . rosuvastatin (CRESTOR) 10 MG tablet Take 1 tablet (10 mg total) by mouth daily.  Marland Kitchen testosterone cypionate (DEPO-TESTOSTERONE) 100 MG/ML injection Inject 3 mLs (300 mg total) into the muscle every 14 (fourteen) days.  . [DISCONTINUED] testosterone cypionate  (DEPO-TESTOSTERONE) 100 MG/ML injection Inject 2 mLs (200 mg total) into the muscle every 14 (fourteen) days.   No facility-administered encounter medications on file as of 03/09/2019.    Surgical History: Past Surgical History:  Procedure Laterality Date  . right hip replacement Right 06/17/2014    Medical History: Past Medical History:  Diagnosis Date  . Anxiety   . Hyperlipidemia     Family History: Family History  Problem Relation Age of Onset  . Hypertension Mother   . Hyperlipidemia Father   . Hypertension Father     Social History   Socioeconomic History  . Marital status: Married    Spouse name: Not on file  . Number of children: Not on file  . Years of education: Not on file  . Highest education level: Not on file  Occupational History  . Not on file  Tobacco Use  . Smoking status: Current Some Day Smoker    Types: Cigarettes    Start date: 09/03/2017  . Smokeless tobacco: Current User  Substance and Sexual Activity  . Alcohol use: Yes    Comment: social  . Drug use: No  . Sexual activity: Not on file  Other Topics Concern  . Not on file  Social History Narrative  . Not on file   Social Determinants of Health   Financial Resource Strain:   . Difficulty of Paying Living Expenses: Not on file  Food Insecurity:   . Worried About Charity fundraiser in the Last Year: Not on file  . Ran Out of Food in  the Last Year: Not on file  Transportation Needs:   . Lack of Transportation (Medical): Not on file  . Lack of Transportation (Non-Medical): Not on file  Physical Activity:   . Days of Exercise per Week: Not on file  . Minutes of Exercise per Session: Not on file  Stress:   . Feeling of Stress : Not on file  Social Connections:   . Frequency of Communication with Friends and Family: Not on file  . Frequency of Social Gatherings with Friends and Family: Not on file  . Attends Religious Services: Not on file  . Active Member of Clubs or  Organizations: Not on file  . Attends Archivist Meetings: Not on file  . Marital Status: Not on file  Intimate Partner Violence:   . Fear of Current or Ex-Partner: Not on file  . Emotionally Abused: Not on file  . Physically Abused: Not on file  . Sexually Abused: Not on file      Review of Systems  Constitutional: Negative for activity change, fatigue and unexpected weight change.  HENT: Negative for ear pain.   Respiratory: Negative for chest tightness, shortness of breath and wheezing.   Cardiovascular: Negative for chest pain and palpitations.       Mildly elevated blood pressure today.   Gastrointestinal: Negative for abdominal pain, diarrhea, nausea and vomiting.  Endocrine: Negative for cold intolerance, heat intolerance, polydipsia and polyuria.       History of testosterone hypofunction and pituitary adenoma. Sees endocrinologist at [facility] for monitoring. Recent check of testosterone per endocrinology was 108  Musculoskeletal: Negative for arthralgias and back pain.  Skin:       Increased acne on his back.   Allergic/Immunologic: Negative for environmental allergies.  Neurological: Negative for weakness and headaches.  Hematological: Negative for adenopathy.  Psychiatric/Behavioral: Negative for agitation, decreased concentration and dysphoric mood. The patient is not nervous/anxious.        Taking buspirone every once in a while, when needed for acute anxiety and irritability. No longer taking fluoxetine.     Today's Vitals   03/09/19 1436  BP: (!) 142/82  Pulse: 75  Resp: 16  Temp: (!) 97.5 F (36.4 C)  SpO2: 99%  Weight: 271 lb 3.2 oz (123 kg)  Height: 6\' 2"  (1.88 m)   Body mass index is 34.82 kg/m.  Physical Exam Vitals and nursing note reviewed.  Constitutional:      General: He is not in acute distress.    Appearance: Normal appearance. He is well-developed. He is not diaphoretic.  HENT:     Head: Normocephalic and atraumatic.      Mouth/Throat:     Pharynx: No oropharyngeal exudate.  Eyes:     Pupils: Pupils are equal, round, and reactive to light.  Neck:     Thyroid: No thyromegaly.     Vascular: No carotid bruit or JVD.     Trachea: No tracheal deviation.  Cardiovascular:     Rate and Rhythm: Normal rate and regular rhythm.     Heart sounds: Normal heart sounds. No murmur. No friction rub. No gallop.   Pulmonary:     Effort: Pulmonary effort is normal. No respiratory distress.     Breath sounds: No wheezing or rales.  Chest:     Chest wall: No tenderness.  Abdominal:     Palpations: Abdomen is soft.  Musculoskeletal:        General: Normal range of motion.     Cervical back:  Normal range of motion and neck supple.  Lymphadenopathy:     Cervical: No cervical adenopathy.  Skin:    General: Skin is warm and dry.  Neurological:     Mental Status: He is alert and oriented to person, place, and time.     Cranial Nerves: No cranial nerve deficit.  Psychiatric:        Behavior: Behavior normal.        Thought Content: Thought content normal.        Judgment: Judgment normal.    Assessment/Plan: 1. Essential hypertension Generally stable. Continue bp medication as prescribed   2. Testicular hypofunction Still very low despite taking testoserone injections 200mg  every other week. Increased dose to 300mg  every other week. Recommend he discuss this with his endocrinologist for further management.  - testosterone cypionate (DEPO-TESTOSTERONE) 100 MG/ML injection; Inject 3 mLs (300 mg total) into the muscle every 14 (fourteen) days.  Dispense: 10 mL; Refill: 3  3. GAD (generalized anxiety disorder) May take buspirone as needed and as prescribed   4. Pituitary microadenoma ([place]) continued follow up per endocrinology. Recommend he discuss testosterone treatment with endocrinology.   General Counseling: Jowel verbalizes understanding of the findings of todays visit and agrees with plan of treatment. I have  discussed any further diagnostic evaluation that may be needed or ordered today. We also reviewed his medications today. he has been encouraged to call the office with any questions or concerns that should arise related to todays visit.   Hypertension Counseling:   The following hypertensive lifestyle modification were recommended and discussed:  1. Limiting alcohol intake to less than 1 oz/day of ethanol:(24 oz of beer or 8 oz of wine or 2 oz of 100-proof whiskey). 2. Take baby ASA 81 mg daily. 3. Importance of regular aerobic exercise and losing weight. 4. Reduce dietary saturated fat and cholesterol intake for overall cardiovascular health. 5. Maintaining adequate dietary potassium, calcium, and magnesium intake. 6. Regular monitoring of the blood pressure. 7. Reduce sodium intake to less than 100 mmol/day (less than 2.3 gm of sodium or less than 6 gm of sodium choride)   This patient was seen by [place] with Dr Lavera Guise as a part of collaborative care agreement  Meds ordered this encounter  Medications  . testosterone cypionate (DEPO-TESTOSTERONE) 100 MG/ML injection    Sig: Inject 3 mLs (300 mg total) into the muscle every 14 (fourteen) days.    Dispense:  10 mL    Refill:  3    Please note increase dosing    Order Specific Question:   Supervising Provider    Answer:   Lavera Guise [id number]    Time spent: 35 Minutes      Dr Lavera Guise Internal medicine

## 2019-04-06 ENCOUNTER — Other Ambulatory Visit: Payer: Self-pay

## 2019-04-06 MED ORDER — LOSARTAN POTASSIUM-HCTZ 100-12.5 MG PO TABS: 1.0000 | ORAL_TABLET | Freq: Every day | ORAL | 6 refills | Status: DC

## 2019-04-06 MED ORDER — MINOCYCLINE HCL 100 MG PO CAPS: ORAL_CAPSULE | ORAL | 2 refills | Status: DC

## 2019-04-13 DIAGNOSIS — G4733 Obstructive sleep apnea (adult) (pediatric): Secondary | ICD-10-CM | POA: Diagnosis not present

## 2019-05-04 ENCOUNTER — Other Ambulatory Visit: Payer: Self-pay

## 2019-05-04 DIAGNOSIS — E785 Hyperlipidemia, unspecified: Secondary | ICD-10-CM

## 2019-05-04 MED ORDER — ROSUVASTATIN CALCIUM 10 MG PO TABS: 10.0000 mg | ORAL_TABLET | Freq: Every day | ORAL | 5 refills | Status: DC

## 2019-06-09 ENCOUNTER — Telehealth: Payer: Self-pay

## 2019-06-09 NOTE — Telephone Encounter (Signed)
CONFIRMED AND SCREENED FOR 06-11-19 OV. 

## 2019-06-11 ENCOUNTER — Other Ambulatory Visit: Payer: Self-pay

## 2019-06-11 ENCOUNTER — Encounter: Payer: Self-pay | Admitting: Nurse Practitioner

## 2019-06-11 ENCOUNTER — Ambulatory Visit: Payer: BC Managed Care – PPO | Admitting: Nurse Practitioner

## 2019-06-11 VITALS — BP 135/95 | HR 75 | Temp 97.3°F | Resp 16 | Ht 74.0 in | Wt 250.0 lb

## 2019-06-11 DIAGNOSIS — I10 Essential (primary) hypertension: Secondary | ICD-10-CM | POA: Diagnosis not present

## 2019-06-11 DIAGNOSIS — E785 Hyperlipidemia, unspecified: Secondary | ICD-10-CM

## 2019-06-11 DIAGNOSIS — D352 Benign neoplasm of pituitary gland: Secondary | ICD-10-CM

## 2019-06-11 DIAGNOSIS — E291 Testicular hypofunction: Secondary | ICD-10-CM | POA: Diagnosis not present

## 2019-06-11 MED ORDER — TESTOSTERONE CYPIONATE 100 MG/ML IM SOLN: 300.0000 mg | INTRAMUSCULAR | 3 refills | Status: DC

## 2019-06-11 NOTE — Progress Notes (Signed)
[facility] [place], [place] [address]  Internal MEDICINE  Office Visit Note  Patient Name: Stephen Lowery  [id number]  [id number]  Date of Service: 06/17/2019  Chief Complaint  Patient presents with  . Follow-up  . Hyperlipidemia    The patient is here for routine follow up. He did have labs done through endocrinologist at [facility] in 01/2019. Testosterone levels still very low. He is taking testosterone injections 200mg  every two weeks. 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 Unsure if this is leading to resistent testosterone levels. He is due to go back to endocrinologist later this month to have labs rechecked. Diastolic blood pressure is a bit elevated. He states that he is taking blood pressure medication as prescribed. Has made this part of his morning routine. Has been eating better and working out every morning.  He has no concerns or complaints today. Denies chest pain, chest pressure, or shortness of breath. Denies headaches or neurological symptoms. He does need to have his testosterone refilled.       Current Medication: Outpatient Encounter Medications as of 06/11/2019  Medication Sig  . albuterol (PROVENTIL HFA;VENTOLIN HFA) 108 (90 Base) MCG/ACT inhaler Inhale 2 puffs into the lungs every 6 (six) hours as needed for wheezing or shortness of breath.  . B-D INTEGRA SYRINGE 22G X 1-1/2" 3 ML MISC To use for testosterone injections.  . busPIRone (BUSPAR) 10 MG tablet Take 10 mg by mouth daily.  Marland Kitchen losartan-hydrochlorothiazide (HYZAAR) 100-12.5 MG tablet Take 1 tablet by mouth daily.  . minocycline (MINOCIN) 100 MG capsule TK 1 C PO D FOR ACNE  . omeprazole (PRILOSEC) 40 MG capsule TK 1 C PO QD 30 MIN B THE EVE MEAL  . rosuvastatin (CRESTOR) 10 MG tablet Take 1 tablet (10 mg total) by mouth daily.  Marland Kitchen testosterone cypionate (DEPO-TESTOSTERONE) 100 MG/ML injection Inject 3 mLs (300 mg total) into the muscle every 14 (fourteen) days.  . [DISCONTINUED]  testosterone cypionate (DEPO-TESTOSTERONE) 100 MG/ML injection Inject 3 mLs (300 mg total) into the muscle every 14 (fourteen) days.   No facility-administered encounter medications on file as of 06/11/2019.    Surgical History: Past Surgical History:  Procedure Laterality Date  . right hip replacement Right 06/17/2014    Medical History: Past Medical History:  Diagnosis Date  . Anxiety   . Hyperlipidemia     Family History: Family History  Problem Relation Age of Onset  . Hypertension Mother   . Hyperlipidemia Father   . Hypertension Father     Social History   Socioeconomic History  . Marital status: Married    Spouse name: Not on file  . Number of children: Not on file  . Years of education: Not on file  . Highest education level: Not on file  Occupational History  . Not on file  Tobacco Use  . Smoking status: Current Some Day Smoker    Types: Cigarettes    Start date: 09/03/2017  . Smokeless tobacco: Current User  Substance and Sexual Activity  . Alcohol use: Yes    Comment: social  . Drug use: No  . Sexual activity: Not on file  Other Topics Concern  . Not on file  Social History Narrative  . Not on file   Social Determinants of Health   Financial Resource Strain:   . Difficulty of Paying Living Expenses:   Food Insecurity:   . Worried About Charity fundraiser in the Last Year:   . [employer]  of Food in the Last Year:   Transportation Needs:   . Film/video editor (Medical):   Marland Kitchen Lack of Transportation (Non-Medical):   Physical Activity:   . Days of Exercise per Week:   . Minutes of Exercise per Session:   Stress:   . Feeling of Stress :   Social Connections:   . Frequency of Communication with Friends and Family:   . Frequency of Social Gatherings with Friends and Family:   . Attends Religious Services:   . Active Member of Clubs or Organizations:   . Attends Archivist Meetings:   Marland Kitchen Marital Status:   Intimate Partner Violence:    . Fear of Current or Ex-Partner:   . Emotionally Abused:   Marland Kitchen Physically Abused:   . Sexually Abused:       Review of Systems  Constitutional: Negative for activity change, fatigue and unexpected weight change.  HENT: Negative for ear pain.   Respiratory: Negative for chest tightness, shortness of breath and wheezing.   Cardiovascular: Negative for chest pain and palpitations.  Gastrointestinal: Negative for abdominal pain, diarrhea, nausea and vomiting.  Endocrine: Negative for cold intolerance, heat intolerance, polydipsia and polyuria.       History of testicular hypofunction and pituitary adenoma. Sees endocrinologist at [facility] for monitoring. Recent check of testosterone per endocrinology was 108  Musculoskeletal: Negative for arthralgias and back pain.  Skin:       Increased acne on his back.   Allergic/Immunologic: Negative for environmental allergies.  Neurological: Negative for weakness and headaches.  Hematological: Negative for adenopathy.  Psychiatric/Behavioral: Negative for agitation, decreased concentration and dysphoric mood. The patient is not nervous/anxious.        Taking buspirone every once in a while, when needed for acute anxiety and irritability. No longer taking fluoxetine.    Today's Vitals   06/11/19 0858  BP: (!) 135/95  Pulse: 75  Resp: 16  Temp: (!) 97.3 F (36.3 C)  SpO2: 100%  Weight: 250 lb (113.4 kg)  Height: 6\' 2"  (1.88 m)   Body mass index is 32.1 kg/m.  Physical Exam Vitals and nursing note reviewed.  Constitutional:      General: He is not in acute distress.    Appearance: Normal appearance. He is well-developed. He is not diaphoretic.  HENT:     Head: Normocephalic and atraumatic.     Mouth/Throat:     Pharynx: No oropharyngeal exudate.  Eyes:     Pupils: Pupils are equal, round, and reactive to light.  Neck:     Thyroid: No thyromegaly.     Vascular: No carotid bruit or JVD.     Trachea: No tracheal deviation.   Cardiovascular:     Rate and Rhythm: Normal rate and regular rhythm.     Heart sounds: Normal heart sounds. No murmur. No friction rub. No gallop.   Pulmonary:     Effort: Pulmonary effort is normal. No respiratory distress.     Breath sounds: Normal breath sounds. No wheezing or rales.  Chest:     Chest wall: No tenderness.  Abdominal:     Palpations: Abdomen is soft.  Musculoskeletal:        General: Normal range of motion.     Cervical back: Normal range of motion and neck supple.  Lymphadenopathy:     Cervical: No cervical adenopathy.  Skin:    General: Skin is warm and dry.  Neurological:     Mental Status: He is alert and oriented  to person, place, and time.     Cranial Nerves: No cranial nerve deficit.  Psychiatric:        Mood and Affect: Mood normal.        Behavior: Behavior normal.        Thought Content: Thought content normal.        Judgment: Judgment normal.    Assessment/Plan: 1. Essential hypertension Stable. Continue bp medication as prescribed.   2. Testicular hypofunction Will continue testosterone injections at current dosing. Scheduled to see endocrinologist for further lab testing later this month. Will adjust dosing as indicated.  - testosterone cypionate (DEPO-TESTOSTERONE) 100 MG/ML injection; Inject 3 mLs (300 mg total) into the muscle every 14 (fourteen) days.  Dispense: 10 mL; Refill: 3  3. Pituitary microadenoma ([place]) On dostenex. Continue regular visits with endocrinology as scheduled.   4. Hyperlipidemia, unspecified hyperlipidemia type Statin as prescribed.   General Counseling: Jocsan verbalizes understanding of the findings of todays visit and agrees with plan of treatment. I have discussed any further diagnostic evaluation that may be needed or ordered today. We also reviewed his medications today. he has been encouraged to call the office with any questions or concerns that should arise related to todays visit.  This patient was seen by  [place] with Dr Lavera Guise as a part of collaborative care agreement  Meds ordered this encounter  Medications  . testosterone cypionate (DEPO-TESTOSTERONE) 100 MG/ML injection    Sig: Inject 3 mLs (300 mg total) into the muscle every 14 (fourteen) days.    Dispense:  10 mL    Refill:  3    Please note increase dosing    Order Specific Question:   Supervising Provider    Answer:   Lavera Guise [id number]    Total time spent: 20 Minutes  Time spent includes review of chart, medications, test results, and follow up plan with the patient.      Dr Lavera Guise Internal medicine

## 2019-07-06 ENCOUNTER — Ambulatory Visit: Payer: Self-pay | Attending: Internal Medicine

## 2019-07-06 DIAGNOSIS — Z23 Encounter for immunization: Secondary | ICD-10-CM

## 2019-07-06 NOTE — Progress Notes (Signed)
   Covid-19 Vaccination Clinic  Name:  Stephen Lowery    MRN: [id number] DOB: March 13, 1976  07/06/2019  Stephen Lowery was observed post Covid-19 immunization for 15 minutes without incident. He was provided with Vaccine Information Sheet and instruction to access the V-Safe system.   Stephen Lowery was instructed to call 911 with any severe reactions post vaccine: Marland Kitchen Difficulty breathing  . Swelling of face and throat  . A fast heartbeat  . A bad rash all over body  . Dizziness and weakness   Immunizations Administered    Name Date Dose VIS Date Route   Pfizer COVID-19 Vaccine 07/06/2019  8:45 AM 0.3 mL 03/06/2019 Intramuscular   Manufacturer: [place]   Lot: [id number]   [place]: [id number]

## 2019-07-28 ENCOUNTER — Ambulatory Visit: Payer: Self-pay | Attending: Internal Medicine

## 2019-07-28 DIAGNOSIS — Z23 Encounter for immunization: Secondary | ICD-10-CM

## 2019-07-28 NOTE — Progress Notes (Signed)
   Covid-19 Vaccination Clinic  Name:  Stephen Lowery    MRN: [id number] DOB: 08-Jul-1975  07/28/2019  [name] was observed post Covid-19 immunization for 15 minutes without incident. He was provided with Vaccine Information Sheet and instruction to access the V-Safe system.   Mr. Standard was instructed to call 911 with any severe reactions post vaccine: Marland Kitchen Difficulty breathing  . Swelling of face and throat  . A fast heartbeat  . A bad rash all over body  . Dizziness and weakness   Immunizations Administered    Name Date Dose VIS Date Route   Pfizer COVID-19 Vaccine 07/28/2019  1:06 PM 0.3 mL 05/20/2018 Intramuscular   Manufacturer: [place]   Lot: [id number]   [place]: [id number]

## 2019-08-11 DIAGNOSIS — Z03818 Encounter for observation for suspected exposure to other biological agents ruled out: Secondary | ICD-10-CM | POA: Diagnosis not present

## 2019-08-12 DIAGNOSIS — E230 Hypopituitarism: Secondary | ICD-10-CM | POA: Diagnosis not present

## 2019-08-12 DIAGNOSIS — E785 Hyperlipidemia, unspecified: Secondary | ICD-10-CM | POA: Diagnosis not present

## 2019-08-12 DIAGNOSIS — E237 Disorder of pituitary gland, unspecified: Secondary | ICD-10-CM | POA: Diagnosis not present

## 2019-08-12 DIAGNOSIS — Z6829 Body mass index (BMI) 29.0-29.9, adult: Secondary | ICD-10-CM | POA: Diagnosis not present

## 2019-08-21 DIAGNOSIS — R220 Localized swelling, mass and lump, head: Secondary | ICD-10-CM | POA: Diagnosis not present

## 2019-08-21 DIAGNOSIS — R239 Unspecified skin changes: Secondary | ICD-10-CM | POA: Diagnosis not present

## 2019-09-01 ENCOUNTER — Other Ambulatory Visit: Payer: Self-pay

## 2019-09-01 DIAGNOSIS — E785 Hyperlipidemia, unspecified: Secondary | ICD-10-CM

## 2019-09-01 MED ORDER — ROSUVASTATIN CALCIUM 10 MG PO TABS: 10.0000 mg | ORAL_TABLET | Freq: Every day | ORAL | 5 refills | Status: DC

## 2019-09-01 MED ORDER — MINOCYCLINE HCL 100 MG PO CAPS: ORAL_CAPSULE | ORAL | 3 refills | Status: DC

## 2019-09-08 ENCOUNTER — Telehealth: Payer: Self-pay

## 2019-09-08 NOTE — Telephone Encounter (Signed)
Confirmed and screened for ov.

## 2019-09-10 ENCOUNTER — Encounter: Payer: Self-pay | Admitting: Nurse Practitioner

## 2019-09-10 ENCOUNTER — Ambulatory Visit: Payer: BC Managed Care – PPO | Admitting: Nurse Practitioner

## 2019-09-10 ENCOUNTER — Other Ambulatory Visit: Payer: Self-pay

## 2019-09-10 VITALS — BP 148/96 | HR 84 | Temp 97.6°F | Resp 16 | Ht 74.0 in | Wt 225.0 lb

## 2019-09-10 DIAGNOSIS — E785 Hyperlipidemia, unspecified: Secondary | ICD-10-CM | POA: Diagnosis not present

## 2019-09-10 DIAGNOSIS — D352 Benign neoplasm of pituitary gland: Secondary | ICD-10-CM

## 2019-09-10 DIAGNOSIS — I10 Essential (primary) hypertension: Secondary | ICD-10-CM | POA: Diagnosis not present

## 2019-09-10 DIAGNOSIS — E291 Testicular hypofunction: Secondary | ICD-10-CM | POA: Diagnosis not present

## 2019-09-10 MED ORDER — "BD INTEGRA SYRINGE 22G X 1-1/2"" 3 ML MISC": 5 refills | Status: DC

## 2019-09-10 MED ORDER — TESTOSTERONE CYPIONATE 100 MG/ML IM SOLN: 300.0000 mg | INTRAMUSCULAR | 3 refills | Status: DC

## 2019-09-10 NOTE — Progress Notes (Signed)
[facility] [place], [place] [address]  Internal MEDICINE  Office Visit Note  Patient Name: Stephen Lowery  [id number]  [id number]  Date of Service: 09/10/2019  Chief Complaint  Patient presents with  . Follow-up  . Hyperlipidemia    The patient is here for routine follow up. Currently takes testosterone injections, 300mg  every two weeks. Recently had levels checked per endocrinology. 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 testosterone level on 08/12/2019 was 635. His lipid panel and prolactin levels were normal. This does seem to be more effective dose for him.  Blood pressure is elevated a little. He states that he has had three cups of coffee this morning and he is sure that is contributing. He does have some nasal congestion and post nasal drip which started yesterday. He has not taken anything for this. He has no other concerns or complaints today.       Current Medication: Outpatient Encounter Medications as of 09/10/2019  Medication Sig  . albuterol (PROVENTIL HFA;VENTOLIN HFA) 108 (90 Base) MCG/ACT inhaler Inhale 2 puffs into the lungs every 6 (six) hours as needed for wheezing or shortness of breath.  . B-D INTEGRA SYRINGE 22G X 1-1/2" 3 ML MISC To use for testosterone injections.  . busPIRone (BUSPAR) 10 MG tablet Take 10 mg by mouth daily.  Marland Kitchen losartan-hydrochlorothiazide (HYZAAR) 100-12.5 MG tablet Take 1 tablet by mouth daily.  . minocycline (MINOCIN) 100 MG capsule TK 1 C PO D FOR ACNE  . omeprazole (PRILOSEC) 40 MG capsule TK 1 C PO QD 30 MIN B THE EVE MEAL  . rosuvastatin (CRESTOR) 10 MG tablet Take 1 tablet (10 mg total) by mouth daily.  Marland Kitchen testosterone cypionate (DEPO-TESTOSTERONE) 100 MG/ML injection Inject 3 mLs (300 mg total) into the muscle every 14 (fourteen) days.  . [DISCONTINUED] B-D INTEGRA SYRINGE 22G X 1-1/2" 3 ML MISC To use for testosterone injections.  . [DISCONTINUED] testosterone cypionate (DEPO-TESTOSTERONE) 100 MG/ML injection  Inject 3 mLs (300 mg total) into the muscle every 14 (fourteen) days.   No facility-administered encounter medications on file as of 09/10/2019.    Surgical History: Past Surgical History:  Procedure Laterality Date  . right hip replacement Right 06/17/2014    Medical History: Past Medical History:  Diagnosis Date  . Anxiety   . Hyperlipidemia     Family History: Family History  Problem Relation Age of Onset  . Hypertension Mother   . Hyperlipidemia Father   . Hypertension Father     Social History   Socioeconomic History  . Marital status: Married    Spouse name: Not on file  . Number of children: Not on file  . Years of education: Not on file  . Highest education level: Not on file  Occupational History  . Not on file  Tobacco Use  . Smoking status: Current Every Day Smoker    Packs/day: 1.00    Types: Cigarettes    Start date: 09/03/2017  . Smokeless tobacco: Current User  Substance and Sexual Activity  . Alcohol use: Yes    Comment: social  . Drug use: No  . Sexual activity: Not on file  Other Topics Concern  . Not on file  Social History Narrative  . Not on file   Social Determinants of Health   Financial Resource Strain:   . Difficulty of Paying Living Expenses:   Food Insecurity:   . Worried About Charity fundraiser in the Last Year:   . [employer]  of Food in the Last Year:   Transportation Needs:   . Film/video editor (Medical):   Marland Kitchen Lack of Transportation (Non-Medical):   Physical Activity:   . Days of Exercise per Week:   . Minutes of Exercise per Session:   Stress:   . Feeling of Stress :   Social Connections:   . Frequency of Communication with Friends and Family:   . Frequency of Social Gatherings with Friends and Family:   . Attends Religious Services:   . Active Member of Clubs or Organizations:   . Attends Archivist Meetings:   Marland Kitchen Marital Status:   Intimate Partner Violence:   . Fear of Current or Ex-Partner:   .  Emotionally Abused:   Marland Kitchen Physically Abused:   . Sexually Abused:       Review of Systems  Constitutional: Negative for activity change, fatigue and unexpected weight change.  HENT: Negative for ear pain.   Respiratory: Negative for chest tightness, shortness of breath and wheezing.   Cardiovascular: Negative for chest pain and palpitations.  Gastrointestinal: Negative for abdominal pain, diarrhea, nausea and vomiting.  Endocrine: Negative for cold intolerance, heat intolerance, polydipsia and polyuria.       History of testicular hypofunction and pituitary adenoma. Sees endocrinologist at [facility] for monitoring. Recent check of testosterone per endocrinology was 635 on 08/12/2019  Musculoskeletal: Negative for arthralgias and back pain.  Skin:       Increased acne on his back.   Allergic/Immunologic: Negative for environmental allergies.  Neurological: Negative for weakness and headaches.  Hematological: Negative for adenopathy.  Psychiatric/Behavioral: Negative for agitation, decreased concentration and dysphoric mood. The patient is not nervous/anxious.        Taking buspirone every once in a while, when needed for acute anxiety and irritability. No longer taking fluoxetine.     Today's Vitals   09/10/19 0837  BP: (!) 148/96  Pulse: 84  Resp: 16  Temp: 97.6 F (36.4 C)  SpO2: 98%  Weight: 225 lb (102.1 kg)  Height: 6\' 2"  (1.88 m)   Body mass index is 28.89 kg/m.  Physical Exam Vitals and nursing note reviewed.  Constitutional:      General: He is not in acute distress.    Appearance: Normal appearance. He is well-developed. He is not diaphoretic.  HENT:     Head: Normocephalic and atraumatic.     Mouth/Throat:     Pharynx: No oropharyngeal exudate.  Eyes:     Pupils: Pupils are equal, round, and reactive to light.  Neck:     Thyroid: No thyromegaly.     Vascular: No carotid bruit or JVD.     Trachea: No tracheal deviation.  Cardiovascular:     Rate and Rhythm:  Normal rate and regular rhythm.     Heart sounds: Normal heart sounds. No murmur heard.  No friction rub. No gallop.   Pulmonary:     Effort: Pulmonary effort is normal. No respiratory distress.     Breath sounds: Normal breath sounds. No wheezing or rales.  Chest:     Chest wall: No tenderness.  Abdominal:     Palpations: Abdomen is soft.  Musculoskeletal:        General: Normal range of motion.     Cervical back: Normal range of motion and neck supple.  Lymphadenopathy:     Cervical: No cervical adenopathy.  Skin:    General: Skin is warm and dry.  Neurological:     Mental Status: He  is alert and oriented to person, place, and time.     Cranial Nerves: No cranial nerve deficit.  Psychiatric:        Mood and Affect: Mood normal.        Behavior: Behavior normal.        Thought Content: Thought content normal.        Judgment: Judgment normal.    Assessment/Plan: 1. Essential hypertension Generally stable. Continue bp medication as prescribed   2. Testicular hypofunction Most recent testosterone check 635. Continue testosterone 300mg  every other week. Continue regular visits with endocrinology as scheduled.  - B-D INTEGRA SYRINGE 22G X 1-1/2" 3 ML MISC; To use for testosterone injections.  Dispense: 5 each; Refill: 5 - testosterone cypionate (DEPO-TESTOSTERONE) 100 MG/ML injection; Inject 3 mLs (300 mg total) into the muscle every 14 (fourteen) days.  Dispense: 10 mL; Refill: 3  3. Hyperlipidemia, unspecified hyperlipidemia type Recent check of cholesterol panel was normal. Continue rosuvastatin at current dose.   4. Pituitary microadenoma ([place]) Continue regular visits with endocrinology as scheduled   General Counseling: Kellon verbalizes understanding of the findings of todays visit and agrees with plan of treatment. I have discussed any further diagnostic evaluation that may be needed or ordered today. We also reviewed his medications today. he has been encouraged to call  the office with any questions or concerns that should arise related to todays visit.    This patient was seen by Black with Dr Lavera Guise as a part of collaborative care agreement  Meds ordered this encounter  Medications  . B-D INTEGRA SYRINGE 22G X 1-1/2" 3 ML MISC    Sig: To use for testosterone injections.    Dispense:  5 each    Refill:  5    Order Specific Question:   Supervising Provider    Answer:   Lavera Guise [[id number]]  . testosterone cypionate (DEPO-TESTOSTERONE) 100 MG/ML injection    Sig: Inject 3 mLs (300 mg total) into the muscle every 14 (fourteen) days.    Dispense:  10 mL    Refill:  3    Order Specific Question:   Supervising Provider    Answer:   Lavera Guise [[id number]]    Total time spent: 25 Minutes   Time spent includes review of chart, medications, test results, and follow up plan with the patient.      Dr Lavera Guise Internal medicine

## 2019-09-15 DIAGNOSIS — Z96641 Presence of right artificial hip joint: Secondary | ICD-10-CM | POA: Diagnosis not present

## 2019-09-30 DIAGNOSIS — J40 Bronchitis, not specified as acute or chronic: Secondary | ICD-10-CM | POA: Diagnosis not present

## 2019-09-30 DIAGNOSIS — B349 Viral infection, unspecified: Secondary | ICD-10-CM | POA: Diagnosis not present

## 2019-09-30 DIAGNOSIS — R0981 Nasal congestion: Secondary | ICD-10-CM | POA: Diagnosis not present

## 2019-10-01 ENCOUNTER — Other Ambulatory Visit: Payer: Self-pay

## 2019-10-01 MED ORDER — LOSARTAN POTASSIUM-HCTZ 100-12.5 MG PO TABS: 1.0000 | ORAL_TABLET | Freq: Every day | ORAL | 6 refills | Status: DC

## 2019-11-03 ENCOUNTER — Other Ambulatory Visit: Payer: Self-pay

## 2019-11-03 DIAGNOSIS — E291 Testicular hypofunction: Secondary | ICD-10-CM

## 2019-11-03 MED ORDER — "BD INTEGRA SYRINGE 22G X 1-1/2"" 3 ML MISC": 5 refills | Status: AC

## 2019-12-18 ENCOUNTER — Ambulatory Visit: Payer: BC Managed Care – PPO | Admitting: Nurse Practitioner

## 2019-12-22 DIAGNOSIS — M25511 Pain in right shoulder: Secondary | ICD-10-CM | POA: Diagnosis not present

## 2019-12-22 DIAGNOSIS — G8929 Other chronic pain: Secondary | ICD-10-CM | POA: Diagnosis not present

## 2019-12-22 DIAGNOSIS — Z03818 Encounter for observation for suspected exposure to other biological agents ruled out: Secondary | ICD-10-CM | POA: Diagnosis not present

## 2019-12-24 ENCOUNTER — Encounter: Payer: Self-pay | Admitting: Nurse Practitioner

## 2019-12-24 ENCOUNTER — Ambulatory Visit: Payer: BC Managed Care – PPO | Admitting: Nurse Practitioner

## 2019-12-24 ENCOUNTER — Other Ambulatory Visit: Payer: Self-pay

## 2019-12-24 VITALS — BP 142/80 | HR 95 | Temp 98.0°F | Resp 16 | Ht 74.0 in | Wt 218.0 lb

## 2019-12-24 DIAGNOSIS — D352 Benign neoplasm of pituitary gland: Secondary | ICD-10-CM | POA: Diagnosis not present

## 2019-12-24 DIAGNOSIS — M25511 Pain in right shoulder: Secondary | ICD-10-CM

## 2019-12-24 DIAGNOSIS — I10 Essential (primary) hypertension: Secondary | ICD-10-CM | POA: Diagnosis not present

## 2019-12-24 DIAGNOSIS — E291 Testicular hypofunction: Secondary | ICD-10-CM

## 2019-12-24 DIAGNOSIS — E785 Hyperlipidemia, unspecified: Secondary | ICD-10-CM

## 2019-12-24 DIAGNOSIS — G8929 Other chronic pain: Secondary | ICD-10-CM

## 2019-12-24 MED ORDER — TESTOSTERONE CYPIONATE 100 MG/ML IM SOLN: 300.0000 mg | INTRAMUSCULAR | 3 refills | Status: DC

## 2019-12-24 NOTE — Progress Notes (Signed)
[facility] [place], [place] [address]  Internal MEDICINE  Office Visit Note  Patient Name: Stephen Lowery  [id number]  [id number]  Date of Service: 01/13/2020  Chief Complaint  Patient presents with   Follow-up   Hyperlipidemia    The patient is here for routine follow up. He is c/o right shoulder pain. This is chronic, but recently worse. It is starting to impede his ability to work and participate in regular activities. Was seen at [facility] clinic. X-ray was done and he discussed with [facility] walk in. Given muscle relaxer and told to apply heat to his shoulder. Not getting better. He does need to have a referral to orthopedic provider for further evaluation and treatment.  He also has pituitary microadenoma with chronically low testosterone levels. He does see neurology/endocrinology for management of tumor. Gives himself testosterone injections every other week. Last check of testosterone was done at [facility] endocrinology and was 625 on current dose of testosterone. He does need to have new prescription sent to his pharmacy for this. He has no other concerns or complaints today.       Current Medication: Outpatient Encounter Medications as of 12/24/2019  Medication Sig   albuterol (PROVENTIL HFA;VENTOLIN HFA) 108 (90 Base) MCG/ACT inhaler Inhale 2 puffs into the lungs every 6 (six) hours as needed for wheezing or shortness of breath.   B-D INTEGRA SYRINGE 22G X 1-1/2" 3 ML MISC To use for testosterone injections.   busPIRone (BUSPAR) 10 MG tablet Take 10 mg by mouth daily.   losartan-hydrochlorothiazide (HYZAAR) 100-12.5 MG tablet Take 1 tablet by mouth daily.   minocycline (MINOCIN) 100 MG capsule TK 1 C PO D FOR ACNE   omeprazole (PRILOSEC) 40 MG capsule TK 1 C PO QD 30 MIN B THE EVE MEAL   rosuvastatin (CRESTOR) 10 MG tablet Take 1 tablet (10 mg total) by mouth daily.   testosterone cypionate (DEPO-TESTOSTERONE) 100 MG/ML  injection Inject 3 mLs (300 mg total) into the muscle every 14 (fourteen) days.   [DISCONTINUED] testosterone cypionate (DEPO-TESTOSTERONE) 100 MG/ML injection Inject 3 mLs (300 mg total) into the muscle every 14 (fourteen) days.   No facility-administered encounter medications on file as of 12/24/2019.    Surgical History: Past Surgical History:  Procedure Laterality Date   right hip replacement Right 06/17/2014    Medical History: Past Medical History:  Diagnosis Date   Anxiety    Hyperlipidemia     Family History: Family History  Problem Relation Age of Onset   Hypertension Mother    Hyperlipidemia Father    Hypertension Father     Social History   Socioeconomic History   Marital status: Married    Spouse name: Not on file   Number of children: Not on file   Years of education: Not on file   Highest education level: Not on file  Occupational History   Not on file  Tobacco Use   Smoking status: Current Every Day Smoker    Packs/day: 1.00    Types: Cigarettes    Start date: 09/03/2017   Smokeless tobacco: Current User    Types: Snuff  Substance and Sexual Activity   Alcohol use: Yes    Comment: social   Drug use: No   Sexual activity: Not on file  Other Topics Concern   Not on file  Social History Narrative   Not on file   Social Determinants of Health   Financial Resource Strain:  Difficulty of Paying Living Expenses: Not on file  Food Insecurity:    Worried About [place] in the Last Year: Not on file   Ran Out of Food in the Last Year: Not on file  Transportation Needs:    Lack of Transportation (Medical): Not on file   Lack of Transportation (Non-Medical): Not on file  Physical Activity:    Days of Exercise per Week: Not on file   Minutes of Exercise per Session: Not on file  Stress:    Feeling of Stress : Not on file  Social Connections:    Frequency of Communication with Friends and Family: Not on file    Frequency of Social Gatherings with Friends and Family: Not on file   Attends Religious Services: Not on file   Active Member of Clubs or Organizations: Not on file   Attends Archivist Meetings: Not on file   Marital Status: Not on file  Intimate Partner Violence:    Fear of Current or Ex-Partner: Not on file   Emotionally Abused: Not on file   Physically Abused: Not on file   Sexually Abused: Not on file      Review of Systems  Constitutional: Negative for activity change, fatigue and unexpected weight change.  HENT: Negative for ear pain.   Respiratory: Negative for chest tightness, shortness of breath and wheezing.   Cardiovascular: Negative for chest pain and palpitations.  Gastrointestinal: Negative for abdominal pain, diarrhea, nausea and vomiting.  Endocrine: Negative for cold intolerance, heat intolerance, polydipsia and polyuria.       History of testicular hypofunction and pituitary adenoma. Sees endocrinologist at [facility] for monitoring. Recent check of testosterone per endocrinology was 625 on 08/12/2019  Musculoskeletal: Positive for arthralgias and myalgias. Negative for back pain.       Right shoulder pain which has recently gotten worse.   Skin:       Increased acne on his back.   Allergic/Immunologic: Negative for environmental allergies.  Neurological: Negative for weakness and headaches.  Hematological: Negative for adenopathy.  Psychiatric/Behavioral: Negative for agitation, decreased concentration and dysphoric mood. The patient is not nervous/anxious.    Today's Vitals   12/24/19 1545  BP: (!) 142/80  Pulse: 95  Resp: 16  Temp: 98 F (36.7 C)  SpO2: 99%  Weight: 218 lb (98.9 kg)  Height: 6\' 2"  (1.88 m)   Body mass index is 27.99 kg/m.  Physical Exam Vitals and nursing note reviewed.  Constitutional:      General: He is not in acute distress.    Appearance: Normal appearance. He is well-developed. He is not diaphoretic.  HENT:      Head: Normocephalic and atraumatic.     Mouth/Throat:     Pharynx: No oropharyngeal exudate.  Eyes:     Pupils: Pupils are equal, round, and reactive to light.  Neck:     Thyroid: No thyromegaly.     Vascular: No carotid bruit or JVD.     Trachea: No tracheal deviation.  Cardiovascular:     Rate and Rhythm: Normal rate and regular rhythm.     Heart sounds: Normal heart sounds. No murmur heard.  No friction rub. No gallop.   Pulmonary:     Effort: Pulmonary effort is normal. No respiratory distress.     Breath sounds: Normal breath sounds. No wheezing or rales.  Chest:     Chest wall: No tenderness.  Abdominal:     Palpations: Abdomen is soft.  Musculoskeletal:  General: Normal range of motion.     Cervical back: Normal range of motion and neck supple.     Comments: Moderate right shoulder tenderness present. ROM slightly diminished due to pain. No palpable abnormalities or deformities noted at this time.   Lymphadenopathy:     Cervical: No cervical adenopathy.  Skin:    General: Skin is warm and dry.  Neurological:     Mental Status: He is alert and oriented to person, place, and time.     Cranial Nerves: No cranial nerve deficit.  Psychiatric:        Mood and Affect: Mood normal.        Behavior: Behavior normal.        Thought Content: Thought content normal.        Judgment: Judgment normal.    Assessment/Plan:  1. Essential hypertension Generally stable. Continue bp medication as prescribed   2. Hyperlipidemia, unspecified hyperlipidemia type Continue crestor as prescribed   3. Chronic right shoulder pain Refer to orthopedics for further evaluation and treatment. X-ray done through [facility] walk-in clinic. - Ambulatory referral to Orthopedic Surgery  4. Pituitary microadenoma ([place]) Continue regular visits with [facility] endocrinology as scheduled.   5. Testicular hypofunction Most recent testosterone level done 07/2019 and was 625 on current dose  depo-testosterone. Continue as prescribed. Refill sent to his pharmacy today.  - testosterone cypionate (DEPO-TESTOSTERONE) 100 MG/ML injection; Inject 3 mLs (300 mg total) into the muscle every 14 (fourteen) days.  Dispense: 10 mL; Refill: 3   General Counseling: Yomar verbalizes understanding of the findings of todays visit and agrees with plan of treatment. I have discussed any further diagnostic evaluation that may be needed or ordered today. We also reviewed his medications today. he has been encouraged to call the office with any questions or concerns that should arise related to todays visit.  This patient was seen by Leretha Pol FNP Collaboration with Dr Lavera Guise as a part of collaborative care agreement  Orders Placed This Encounter  Procedures   Ambulatory referral to Orthopedic Surgery    Meds ordered this encounter  Medications   testosterone cypionate (DEPO-TESTOSTERONE) 100 MG/ML injection    Sig: Inject 3 mLs (300 mg total) into the muscle every 14 (fourteen) days.    Dispense:  10 mL    Refill:  3    Order Specific Question:   Supervising Provider    Answer:   Lavera Guise [[id number]]    Total time spent:30 Minutes   Time spent includes review of chart, medications, test results, and follow up plan with the patient.      Dr Lavera Guise Internal medicine

## 2020-01-13 DIAGNOSIS — G8929 Other chronic pain: Secondary | ICD-10-CM | POA: Insufficient documentation

## 2020-01-21 ENCOUNTER — Other Ambulatory Visit: Payer: Self-pay

## 2020-01-21 MED ORDER — MINOCYCLINE HCL 100 MG PO CAPS: ORAL_CAPSULE | ORAL | 3 refills | Status: DC

## 2020-04-22 ENCOUNTER — Encounter: Payer: BC Managed Care – PPO | Admitting: Physician Assistant

## 2020-05-28 ENCOUNTER — Other Ambulatory Visit: Payer: Self-pay | Admitting: Nurse Practitioner

## 2020-06-02 ENCOUNTER — Telehealth: Payer: Self-pay

## 2020-06-02 NOTE — Telephone Encounter (Signed)
Billed patient missed appointment fee and rescheduled his appointment. Stephen Lowery

## 2020-06-14 ENCOUNTER — Other Ambulatory Visit: Payer: Self-pay

## 2020-06-14 MED ORDER — MINOCYCLINE HCL 100 MG PO CAPS: ORAL_CAPSULE | ORAL | 0 refills | Status: DC

## 2020-06-27 ENCOUNTER — Other Ambulatory Visit: Payer: Self-pay | Admitting: Internal Medicine

## 2020-06-28 DIAGNOSIS — L578 Other skin changes due to chronic exposure to nonionizing radiation: Secondary | ICD-10-CM | POA: Diagnosis not present

## 2020-06-28 DIAGNOSIS — D229 Melanocytic nevi, unspecified: Secondary | ICD-10-CM | POA: Diagnosis not present

## 2020-06-28 DIAGNOSIS — D485 Neoplasm of uncertain behavior of skin: Secondary | ICD-10-CM | POA: Diagnosis not present

## 2020-06-28 DIAGNOSIS — B351 Tinea unguium: Secondary | ICD-10-CM | POA: Diagnosis not present

## 2020-07-07 ENCOUNTER — Encounter: Payer: Self-pay | Admitting: Physician Assistant

## 2020-07-07 ENCOUNTER — Other Ambulatory Visit: Payer: Self-pay

## 2020-07-07 ENCOUNTER — Ambulatory Visit (INDEPENDENT_AMBULATORY_CARE_PROVIDER_SITE_OTHER): Payer: BC Managed Care – PPO | Admitting: Physician Assistant

## 2020-07-07 DIAGNOSIS — I10 Essential (primary) hypertension: Secondary | ICD-10-CM

## 2020-07-07 DIAGNOSIS — Z0001 Encounter for general adult medical examination with abnormal findings: Secondary | ICD-10-CM | POA: Diagnosis not present

## 2020-07-07 DIAGNOSIS — R5383 Other fatigue: Secondary | ICD-10-CM

## 2020-07-07 DIAGNOSIS — E785 Hyperlipidemia, unspecified: Secondary | ICD-10-CM

## 2020-07-07 DIAGNOSIS — D352 Benign neoplasm of pituitary gland: Secondary | ICD-10-CM

## 2020-07-07 DIAGNOSIS — E291 Testicular hypofunction: Secondary | ICD-10-CM | POA: Diagnosis not present

## 2020-07-07 DIAGNOSIS — R300 Dysuria: Secondary | ICD-10-CM | POA: Diagnosis not present

## 2020-07-07 MED ORDER — LOSARTAN POTASSIUM 25 MG PO TABS: 25.0000 mg | ORAL_TABLET | Freq: Every day | ORAL | 1 refills | Status: DC

## 2020-07-07 MED ORDER — TESTOSTERONE CYPIONATE 100 MG/ML IM SOLN: 200.0000 mg | INTRAMUSCULAR | 2 refills | Status: DC

## 2020-07-07 NOTE — Progress Notes (Signed)
[facility] [place], [place] [address]  Internal MEDICINE  Office Visit Note  Patient Name: Stephen Lowery  [id number]  [id number]  Date of Service: 07/10/2020  Chief Complaint  Patient presents with  . Annual Exam    Pt wants labs, discuss BP meds  . Hyperlipidemia  . Anxiety     HPI Pt is here for routine health maintenance examination -Has been off BP meds and cholesterol meds for 6 months. Lost 70 lbs and felt great so didn't think he needed them anymore. -He has been taking minocycline and testosterone--ran out 3 weeks ago  -he sees doctor at [facility] following pituitary adenoma; significant FHx of adenoma--supposed to start cabergoline 0.5mg  tab once weekly to see if it would shrink before re-imaging, but never took it. Will set phone reminder now. -Hx OSA on CPAP but stopped with wt loss resolving snoring, never retested. -Out of testosterone for 3 weeks. Per [facility] physician he was told he could likely consider lower dose of testosterone based on their lab showing high normal testosterone.    Current Medication: Outpatient Encounter Medications as of 07/07/2020  Medication Sig  . B-D INTEGRA SYRINGE 22G X 1-1/2" 3 ML MISC To use for testosterone injections.  Marland Kitchen losartan (COZAAR) 25 MG tablet Take 1 tablet (25 mg total) by mouth daily.  . minocycline (MINOCIN) 100 MG capsule TAKE ONE CAPSULE BY MOUTH DAILY FOR ACNE  . rosuvastatin (CRESTOR) 10 MG tablet Take 1 tablet (10 mg total) by mouth daily.  . [DISCONTINUED] losartan-hydrochlorothiazide (HYZAAR) 100-12.5 MG tablet Take 1 tablet by mouth daily.  . [DISCONTINUED] testosterone cypionate (DEPO-TESTOSTERONE) 100 MG/ML injection Inject 3 mLs (300 mg total) into the muscle every 14 (fourteen) days.  Marland Kitchen testosterone cypionate (DEPO-TESTOSTERONE) 100 MG/ML injection Inject 2 mLs (200 mg total) into the muscle every 14 (fourteen) days.  . [DISCONTINUED] albuterol (PROVENTIL HFA;VENTOLIN HFA) 108 (90 Base)  MCG/ACT inhaler Inhale 2 puffs into the lungs every 6 (six) hours as needed for wheezing or shortness of breath. (Patient not taking: Reported on 07/07/2020)  . [DISCONTINUED] busPIRone (BUSPAR) 10 MG tablet Take 10 mg by mouth daily. (Patient not taking: Reported on 07/07/2020)  . [DISCONTINUED] omeprazole (PRILOSEC) 40 MG capsule TK 1 C PO QD 30 MIN B THE EVE MEAL (Patient not taking: Reported on 07/07/2020)   No facility-administered encounter medications on file as of 07/07/2020.    Surgical History: Past Surgical History:  Procedure Laterality Date  . right hip replacement Right 06/17/2014    Medical History: Past Medical History:  Diagnosis Date  . Anxiety   . Hyperlipidemia     Family History: Family History  Problem Relation Age of Onset  . Hypertension Mother   . Hyperlipidemia Father   . Hypertension Father       Review of Systems  Constitutional: Negative for chills, fatigue and unexpected weight change.  HENT: Negative for congestion, postnasal drip, rhinorrhea, sneezing and sore throat.   Eyes: Negative for redness.  Respiratory: Negative for cough, chest tightness and shortness of breath.   Cardiovascular: Negative for chest pain and palpitations.  Gastrointestinal: Negative for abdominal pain, constipation, diarrhea, nausea and vomiting.  Genitourinary: Negative for dysuria and frequency.  Musculoskeletal: Negative for arthralgias, back pain, joint swelling and neck pain.  Skin: Negative for rash.  Neurological: Negative.  Negative for tremors and numbness.  Hematological: Negative for adenopathy. Does not bruise/bleed easily.  Psychiatric/Behavioral: Negative for behavioral problems (Depression), sleep disturbance and suicidal ideas. The patient  is not nervous/anxious.      Vital Signs: BP (!) 146/96   Pulse 73   Temp 97.6 F (36.4 C)   Resp 16   Ht 6\' 2"  (1.88 m)   Wt 208 lb 9.6 oz (94.6 kg)   SpO2 99%   BMI 26.78 kg/m    Physical Exam Vitals  and nursing note reviewed.  Constitutional:      General: He is not in acute distress.    Appearance: He is well-developed. He is not diaphoretic.  HENT:     Head: Normocephalic and atraumatic.     Mouth/Throat:     Pharynx: No oropharyngeal exudate.  Eyes:     Pupils: Pupils are equal, round, and reactive to light.  Neck:     Thyroid: No thyromegaly.     Vascular: No JVD.     Trachea: No tracheal deviation.  Cardiovascular:     Rate and Rhythm: Normal rate and regular rhythm.     Heart sounds: Normal heart sounds. No murmur heard. No friction rub. No gallop.   Pulmonary:     Effort: Pulmonary effort is normal. No respiratory distress.     Breath sounds: No wheezing or rales.  Chest:     Chest wall: No tenderness.  Abdominal:     General: Bowel sounds are normal.     Palpations: Abdomen is soft.  Musculoskeletal:        General: Normal range of motion.     Cervical back: Normal range of motion and neck supple.  Lymphadenopathy:     Cervical: No cervical adenopathy.  Skin:    General: Skin is warm and dry.  Neurological:     Mental Status: He is alert and oriented to person, place, and time.     Cranial Nerves: No cranial nerve deficit.  Psychiatric:        Behavior: Behavior normal.        Thought Content: Thought content normal.        Judgment: Judgment normal.      LABS: Recent Results (from the past 2160 hour(s))  UA/M w/rflx Culture, Routine     Status: Abnormal   Collection Time: 07/07/20  8:55 AM   Specimen: Urine   Urine  Result Value Ref Range   Specific Gravity, UA      >=1.030 (A) 1.005 - 1.030   pH, UA 5.5 5.0 - 7.5   Color, UA Yellow Yellow   Appearance Ur Clear Clear   Leukocytes,UA Trace (A) Negative   Protein,UA Trace Negative/Trace   Glucose, UA Negative Negative   Ketones, UA Trace (A) Negative   RBC, UA Negative Negative   Bilirubin, UA Negative Negative   Urobilinogen, Ur 1.0 0.2 - 1.0 mg/dL   Nitrite, UA Negative Negative    Microscopic Examination See below:     Comment: Microscopic was indicated and was performed.   Urinalysis Reflex Comment     Comment: This specimen has reflexed to a Urine Culture.  Microscopic Examination     Status: Abnormal   Collection Time: 07/07/20  8:55 AM   Urine  Result Value Ref Range   WBC, UA None seen 0 - 5 /hpf   RBC 0-2 0 - 2 /hpf   Epithelial Cells (non renal) None seen 0 - 10 /hpf   Casts None seen None seen /lpf   Crystals Present (A) N/A   Crystal Type Calcium Oxalate N/A   Bacteria, UA None seen None seen/Few  Urine Culture, Reflex  Status: None   Collection Time: 07/07/20  8:55 AM   Urine  Result Value Ref Range   Urine Culture, Routine Final report    Organism ID, Bacteria No growth         Assessment/Plan: 1. Encounter for general adult medical examination with abnormal findings Will order routine fasting labs  2. Essential hypertension Will restart on BP meds but at a lower dose given wt loss and only mildly elevated readings. - losartan (COZAAR) 25 MG tablet; Take 1 tablet (25 mg total) by mouth daily.  Dispense: 90 tablet; Refill: 1  3. Testicular hypofunction Will decrease dose from 48mLs to 66mLs every 2 weeks based on suggestion from [facility] endocrinology after they checked his testosterone was at 558 - testosterone cypionate (DEPO-TESTOSTERONE) 100 MG/ML injection; Inject 2 mLs (200 mg total) into the muscle every 14 (fourteen) days.  Dispense: 10 mL; Refill: 2  4. Pituitary microadenoma ([place]) Followed by [facility] endocrinology, pt is supposed to be taking cabergoline to try shrinking tumor but admits he has forgotten to take it. Educated on the importance of adhering to medication regimen as prescribed  5. Hyperlipidemia, unspecified hyperlipidemia type Will recheck labs. May need to restart statin pending labs  6. Other fatigue - Lipid Panel With LDL/HDL Ratio - CBC With Differential - Comprehensive metabolic panel - TSH + free T4 - PSA Total  (Reflex To Free)  7. Dysuria - UA/M w/rflx Culture, Routine   General Counseling: Stephen Lowery verbalizes understanding of the findings of todays visit and agrees with plan of treatment. I have discussed any further diagnostic evaluation that may be needed or ordered today. We also reviewed his medications today. he has been encouraged to call the office with any questions or concerns that should arise related to todays visit.    Counseling:    Orders Placed This Encounter  Procedures  . Microscopic Examination  . Urine Culture, Reflex  . UA/M w/rflx Culture, Routine  . Lipid Panel With LDL/HDL Ratio  . CBC With Differential  . Comprehensive metabolic panel  . TSH + free T4  . PSA Total (Reflex To Free)    Meds ordered this encounter  Medications  . losartan (COZAAR) 25 MG tablet    Sig: Take 1 tablet (25 mg total) by mouth daily.    Dispense:  90 tablet    Refill:  1  . testosterone cypionate (DEPO-TESTOSTERONE) 100 MG/ML injection    Sig: Inject 2 mLs (200 mg total) into the muscle every 14 (fourteen) days.    Dispense:  10 mL    Refill:  2    This patient was seen by Drema Dallas, PA-C in collaboration with Dr. Clayborn Bigness as a part of collaborative care agreement.  Total time spent:30 Minutes  Time spent includes review of chart, medications, test results, and follow up plan with the patient.     Lavera Guise, MD  Internal Medicine

## 2020-07-10 LAB — UA/M W/RFLX CULTURE, ROUTINE
Bilirubin, UA: NEGATIVE
Glucose, UA: NEGATIVE
Nitrite, UA: NEGATIVE
RBC, UA: NEGATIVE
Specific Gravity, UA: >=1.03 — AB (ref 1.005–1.030)
Urobilinogen, Ur: 1 mg/dL (ref 0.2–1.0)
pH, UA: 5.5 (ref 5.0–7.5)

## 2020-07-10 LAB — MICROSCOPIC EXAMINATION
Bacteria, UA: NONE SEEN
Casts: NONE SEEN /lpf
Epithelial Cells (non renal): NONE SEEN /hpf (ref 0–10)
WBC, UA: NONE SEEN /hpf (ref 0–5)

## 2020-07-10 LAB — URINE CULTURE, REFLEX: Organism ID, Bacteria: NO GROWTH

## 2020-08-26 ENCOUNTER — Other Ambulatory Visit: Payer: Self-pay | Admitting: Internal Medicine

## 2020-08-26 NOTE — Telephone Encounter (Signed)
Ok to refills please review

## 2020-10-06 ENCOUNTER — Other Ambulatory Visit: Payer: Self-pay

## 2020-10-06 ENCOUNTER — Ambulatory Visit: Payer: BC Managed Care – PPO | Admitting: Physician Assistant

## 2020-10-06 ENCOUNTER — Encounter: Payer: Self-pay | Admitting: Physician Assistant

## 2020-10-06 VITALS — BP 136/88 | HR 76 | Temp 98.5°F | Resp 16 | Ht 74.0 in | Wt 208.4 lb

## 2020-10-06 DIAGNOSIS — D352 Benign neoplasm of pituitary gland: Secondary | ICD-10-CM

## 2020-10-06 DIAGNOSIS — I10 Essential (primary) hypertension: Secondary | ICD-10-CM

## 2020-10-06 DIAGNOSIS — E291 Testicular hypofunction: Secondary | ICD-10-CM

## 2020-10-06 DIAGNOSIS — E785 Hyperlipidemia, unspecified: Secondary | ICD-10-CM | POA: Diagnosis not present

## 2020-10-06 DIAGNOSIS — L700 Acne vulgaris: Secondary | ICD-10-CM

## 2020-10-06 MED ORDER — MINOCYCLINE HCL 100 MG PO CAPS: ORAL_CAPSULE | ORAL | 2 refills | Status: DC

## 2020-10-06 MED ORDER — ROSUVASTATIN CALCIUM 10 MG PO TABS: 10.0000 mg | ORAL_TABLET | Freq: Every day | ORAL | 1 refills | Status: DC

## 2020-10-06 NOTE — Progress Notes (Signed)
[facility] [place], [place] [address]  Internal MEDICINE  Office Visit Note  Patient Name: Stephen Lowery  [id number]  [id number]  Date of Service: 10/06/2020  Chief Complaint  Patient presents with   Follow-up    Discuss acne meds, med refills and review   Hyperlipidemia   Anxiety    HPI Pt is here for routine follow up -Had covid 3 weeks go, hit fast, but recovered well. Missed his endocrinology appt due to this and will have to reschedule this. He has a pituitary adenoma that is nonfunctioning ans asymptomatic and is monitored regularly. -Pt taking testosterone--doing well on current dose -Denies any anxiety -Tolerating crestor and needs refill -BP not checked at home but stable in office -Discussed acne medication, minocycline has been the only thing to help and has resurgence when he stops medication. Did discuss the potential side effects of long term antibiotic use and pt understood -Did not have labs done  Current Medication: Outpatient Encounter Medications as of 10/06/2020  Medication Sig   B-D INTEGRA SYRINGE 22G X 1-1/2" 3 ML MISC To use for testosterone injections.   losartan (COZAAR) 25 MG tablet Take 1 tablet (25 mg total) by mouth daily.   testosterone cypionate (DEPO-TESTOSTERONE) 100 MG/ML injection Inject 2 mLs (200 mg total) into the muscle every 14 (fourteen) days.   [DISCONTINUED] minocycline (MINOCIN) 100 MG capsule TAKE 1 CAPSULE BY MOUTH DAILY FOR ACNE   [DISCONTINUED] rosuvastatin (CRESTOR) 10 MG tablet Take 1 tablet (10 mg total) by mouth daily.   minocycline (MINOCIN) 100 MG capsule TAKE 1 CAPSULE BY MOUTH DAILY FOR ACNE   rosuvastatin (CRESTOR) 10 MG tablet Take 1 tablet (10 mg total) by mouth daily.   No facility-administered encounter medications on file as of 10/06/2020.    Surgical History: Past Surgical History:  Procedure Laterality Date   right hip replacement Right 06/17/2014    Medical History: Past Medical  History:  Diagnosis Date   Anxiety    Hyperlipidemia     Family History: Family History  Problem Relation Age of Onset   Hypertension Mother    Hyperlipidemia Father    Hypertension Father     Social History   Socioeconomic History   Marital status: Married    Spouse name: Not on file   Number of children: Not on file   Years of education: Not on file   Highest education level: Not on file  Occupational History   Not on file  Tobacco Use   Smoking status: Every Day    Packs/day: 1.00    Types: Cigarettes    Start date: 09/03/2017   Smokeless tobacco: Current    Types: Snuff  Substance and Sexual Activity   Alcohol use: Yes    Comment: social   Drug use: No   Sexual activity: Not on file  Other Topics Concern   Not on file  Social History Narrative   Not on file   Social Determinants of Health   Financial Resource Strain: Not on file  Food Insecurity: Not on file  Transportation Needs: Not on file  Physical Activity: Not on file  Stress: Not on file  Social Connections: Not on file  Intimate Partner Violence: Not on file      Review of Systems  Constitutional:  Negative for chills, fatigue and unexpected weight change.  HENT:  Negative for congestion, postnasal drip, rhinorrhea, sneezing and sore throat.   Eyes:  Negative for redness.  Respiratory:  Negative for cough, chest tightness and shortness of breath.   Cardiovascular:  Negative for chest pain and palpitations.  Gastrointestinal:  Negative for abdominal pain, constipation, diarrhea, nausea and vomiting.  Genitourinary:  Negative for dysuria and frequency.  Musculoskeletal:  Negative for arthralgias, back pain, joint swelling and neck pain.  Skin:  Negative for rash.  Neurological: Negative.  Negative for tremors and numbness.  Hematological:  Negative for adenopathy. Does not bruise/bleed easily.  Psychiatric/Behavioral:  Negative for behavioral problems (Depression), sleep disturbance and  suicidal ideas. The patient is not nervous/anxious.    Vital Signs: BP 136/88   Pulse 76   Temp 98.5 F (36.9 C)   Resp 16   Ht 6\' 2"  (1.88 m)   Wt 208 lb 6.4 oz (94.5 kg)   SpO2 99%   BMI 26.76 kg/m    Physical Exam Vitals and nursing note reviewed.  Constitutional:      General: He is not in acute distress.    Appearance: He is well-developed. He is not diaphoretic.  HENT:     Head: Normocephalic and atraumatic.     Mouth/Throat:     Pharynx: No oropharyngeal exudate.  Eyes:     Pupils: Pupils are equal, round, and reactive to light.  Neck:     Thyroid: No thyromegaly.     Vascular: No JVD.     Trachea: No tracheal deviation.  Cardiovascular:     Rate and Rhythm: Normal rate and regular rhythm.     Heart sounds: Normal heart sounds. No murmur heard.   No friction rub. No gallop.  Pulmonary:     Effort: Pulmonary effort is normal. No respiratory distress.     Breath sounds: No wheezing or rales.  Chest:     Chest wall: No tenderness.  Abdominal:     General: Bowel sounds are normal.     Palpations: Abdomen is soft.  Musculoskeletal:        General: Normal range of motion.     Cervical back: Normal range of motion and neck supple.  Lymphadenopathy:     Cervical: No cervical adenopathy.  Skin:    General: Skin is warm and dry.  Neurological:     Mental Status: He is alert and oriented to person, place, and time.     Cranial Nerves: No cranial nerve deficit.  Psychiatric:        Behavior: Behavior normal.        Thought Content: Thought content normal.        Judgment: Judgment normal.       Assessment/Plan: 1. Essential hypertension Stable, continue losartan  2. Hyperlipidemia, unspecified hyperlipidemia type Continue crestor - rosuvastatin (CRESTOR) 10 MG tablet; Take 1 tablet (10 mg total) by mouth daily.  Dispense: 90 tablet; Refill: 1  3. Testicular hypofunction Continue current dose of testosterone, still has refills available  4. Pituitary  microadenoma ([place]) Followed by endocrinology  5. Acne vulgaris May continue minocycline. S/e of prolonged antibiotic use discussed.  General Counseling: Stephen Lowery verbalizes understanding of the findings of todays visit and agrees with plan of treatment. I have discussed any further diagnostic evaluation that may be needed or ordered today. We also reviewed his medications today. he has been encouraged to call the office with any questions or concerns that should arise related to todays visit.    No orders of the defined types were placed in this encounter.   Meds ordered this encounter  Medications   rosuvastatin (CRESTOR) 10 MG tablet  Sig: Take 1 tablet (10 mg total) by mouth daily.    Dispense:  90 tablet    Refill:  1   minocycline (MINOCIN) 100 MG capsule    Sig: TAKE 1 CAPSULE BY MOUTH DAILY FOR ACNE    Dispense:  30 capsule    Refill:  2    This patient was seen by Drema Dallas, PA-C in collaboration with Dr. Clayborn Bigness as a part of collaborative care agreement.   Total time spent:30 Minutes Time spent includes review of chart, medications, test results, and follow up plan with the patient.      Dr Lavera Guise Internal medicine

## 2020-10-06 NOTE — Progress Notes (Deleted)
[facility] [place], [place] [address]  Internal MEDICINE  Office Visit Note  Patient Name: Stephen Lowery  [id number]  [id number]  Date of Service: 10/06/2020  Chief Complaint  Patient presents with   Follow-up    Discuss acne meds, med refills and review   Hyperlipidemia   Anxiety    HPI     Current Medication: Outpatient Encounter Medications as of 10/06/2020  Medication Sig   B-D INTEGRA SYRINGE 22G X 1-1/2" 3 ML MISC To use for testosterone injections.   losartan (COZAAR) 25 MG tablet Take 1 tablet (25 mg total) by mouth daily.   minocycline (MINOCIN) 100 MG capsule TAKE 1 CAPSULE BY MOUTH DAILY FOR ACNE   rosuvastatin (CRESTOR) 10 MG tablet Take 1 tablet (10 mg total) by mouth daily.   testosterone cypionate (DEPO-TESTOSTERONE) 100 MG/ML injection Inject 2 mLs (200 mg total) into the muscle every 14 (fourteen) days.   No facility-administered encounter medications on file as of 10/06/2020.    Surgical History: Past Surgical History:  Procedure Laterality Date   right hip replacement Right 06/17/2014    Medical History: Past Medical History:  Diagnosis Date   Anxiety    Hyperlipidemia     Family History: Family History  Problem Relation Age of Onset   Hypertension Mother    Hyperlipidemia Father    Hypertension Father     Social History   Socioeconomic History   Marital status: Married    Spouse name: Not on file   Number of children: Not on file   Years of education: Not on file   Highest education level: Not on file  Occupational History   Not on file  Tobacco Use   Smoking status: Every Day    Packs/day: 1.00    Types: Cigarettes    Start date: 09/03/2017   Smokeless tobacco: Current    Types: Snuff  Substance and Sexual Activity   Alcohol use: Yes    Comment: social   Drug use: No   Sexual activity: Not on file  Other Topics Concern   Not on file  Social History Narrative   Not on file   Social  Determinants of Health   Financial Resource Strain: Not on file  Food Insecurity: Not on file  Transportation Needs: Not on file  Physical Activity: Not on file  Stress: Not on file  Social Connections: Not on file  Intimate Partner Violence: Not on file      Review of Systems  Vital Signs: BP 136/88   Pulse 76   Temp 98.5 F (36.9 C)   Resp 16   Ht 6\' 2"  (1.88 m)   Wt 208 lb 6.4 oz (94.5 kg)   SpO2 99%   BMI 26.76 kg/m    Physical Exam     Assessment/Plan:   General Counseling: Stephen Lowery verbalizes understanding of the findings of todays visit and agrees with plan of treatment. I have discussed any further diagnostic evaluation that may be needed or ordered today. We also reviewed his medications today. he has been encouraged to call the office with any questions or concerns that should arise related to todays visit.    No orders of the defined types were placed in this encounter.   No orders of the defined types were placed in this encounter.   This patient was seen by Stephen Dallas, PA-C in collaboration with Dr. Clayborn Lowery as a part of collaborative care agreement.   Total time spent:***  Minutes Time spent includes review of chart, medications, test results, and follow up plan with the patient.      Dr Stephen Lowery Internal medicine

## 2020-10-24 ENCOUNTER — Telehealth: Payer: Self-pay

## 2020-10-24 ENCOUNTER — Other Ambulatory Visit: Payer: Self-pay | Admitting: Physician Assistant

## 2020-10-24 DIAGNOSIS — E291 Testicular hypofunction: Secondary | ICD-10-CM

## 2020-10-24 MED ORDER — TESTOSTERONE CYPIONATE 200 MG/ML IM SOLN: INTRAMUSCULAR | 0 refills | Status: DC

## 2020-10-24 NOTE — Telephone Encounter (Signed)
Pt called and informed us that his testosterone 100 mg/ml was on backorder, I informed Lauren and I called pharmacy and checked what they had available.  They had 200 mg/ml.  Pt takes 200 mg, so Lauren ordered the 200 mg/ml dose and I informed pt that we changed his dose and that he will only take 1 Ml now every 14 days.  Pt understood instructions

## 2020-10-24 NOTE — Telephone Encounter (Signed)
Sent higher strength that is in stock. Patient will now inject only 28m ('200mg'$  total) every 2 weeks.

## 2020-12-24 ENCOUNTER — Other Ambulatory Visit: Payer: Self-pay | Admitting: Nurse Practitioner

## 2021-01-06 ENCOUNTER — Ambulatory Visit: Payer: BC Managed Care – PPO | Admitting: Physician Assistant

## 2021-01-06 ENCOUNTER — Encounter: Payer: Self-pay | Admitting: Physician Assistant

## 2021-01-06 ENCOUNTER — Other Ambulatory Visit: Payer: Self-pay

## 2021-01-06 DIAGNOSIS — E785 Hyperlipidemia, unspecified: Secondary | ICD-10-CM | POA: Diagnosis not present

## 2021-01-06 DIAGNOSIS — E291 Testicular hypofunction: Secondary | ICD-10-CM

## 2021-01-06 DIAGNOSIS — D352 Benign neoplasm of pituitary gland: Secondary | ICD-10-CM | POA: Diagnosis not present

## 2021-01-06 DIAGNOSIS — I10 Essential (primary) hypertension: Secondary | ICD-10-CM

## 2021-01-06 MED ORDER — ROSUVASTATIN CALCIUM 10 MG PO TABS: 10.0000 mg | ORAL_TABLET | Freq: Every day | ORAL | 1 refills | Status: DC

## 2021-01-06 NOTE — Progress Notes (Signed)
[facility] [place], [place] [address]  Internal MEDICINE  Office Visit Note  Patient Name: Stephen Lowery  [id number]  [id number]  Date of Service: 01/06/2021  Chief Complaint  Patient presents with   Follow-up   Hyperlipidemia   Anxiety   Hypertension    HPI Pt is here for routine follow up.  -Not consistent with crestor. Did review labs done by endocrinology showing elevated cholesterol and discussed the importance of taking crestor daily. -Taking testosterone injections every other week. Tolerating this well and testosterone levels drawn by endocrinology did not have free testosterone checked, but did show normal overall testosterone level. Will continue with current dosing. Did have to change formulation due to pharmacy not having the 100mg /mL concentration. He is still taking the same dose but in a higher concentration-lower volume injection. 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 Bothers him to sleep but does not bother him while walking. He wants to hold off on any orthopedic eval at this time. -he is smoking 1ppd now, plans to quit once divorce finalized which he states is in the next few weeks. He states he has quit before and knows he needs to again. -Has not taken BP meds either which explains why BP elevated in office. Discussed he may need an echo for further eval if diastolic readings remain high, but states he will do better with taking medication daily. He didn't really think he needed it anymore after wt loss and has struggled with remembering medications since recommended to restart. -Sleeping well  Current Medication: Outpatient Encounter Medications as of 01/06/2021  Medication Sig   B-D INTEGRA SYRINGE 22G X 1-1/2" 3 ML MISC To use for testosterone injections.   losartan (COZAAR) 25 MG tablet Take 1 tablet (25 mg total) by mouth daily.   minocycline (MINOCIN) 100 MG capsule TAKE 1  CAPSULE BY MOUTH DAILY FOR ACNE   testosterone cypionate (DEPOTESTOSTERONE CYPIONATE) 200 MG/ML injection Inject 25mL (200mg  total) into the muscle every 14 days   [DISCONTINUED] rosuvastatin (CRESTOR) 10 MG tablet Take 1 tablet (10 mg total) by mouth daily.   rosuvastatin (CRESTOR) 10 MG tablet Take 1 tablet (10 mg total) by mouth daily.   No facility-administered encounter medications on file as of 01/06/2021.    Surgical History: Past Surgical History:  Procedure Laterality Date   right hip replacement Right 06/17/2014    Medical History: Past Medical History:  Diagnosis Date   Anxiety    Hyperlipidemia     Family History: Family History  Problem Relation Age of Onset   Hypertension Mother    Hyperlipidemia Father    Hypertension Father     Social History   Socioeconomic History   Marital status: Married    Spouse name: Not on file   Number of children: Not on file   Years of education: Not on file   Highest education level: Not on file  Occupational History   Not on file  Tobacco Use   Smoking status: Every Day    Packs/day: 1.00    Types: Cigarettes    Start date: 09/03/2017   Smokeless tobacco: Current    Types: Snuff  Substance and Sexual Activity   Alcohol use: Yes    Comment: social   Drug use: No   Sexual activity: Not on file  Other Topics Concern   Not on file  Social History Narrative   Not on file   Social  Determinants of Health   Financial Resource Strain: Not on file  Food Insecurity: Not on file  Transportation Needs: Not on file  Physical Activity: Not on file  Stress: Not on file  Social Connections: Not on file  Intimate Partner Violence: Not on file      Review of Systems  Constitutional:  Negative for chills, fatigue and unexpected weight change.  HENT:  Negative for congestion, postnasal drip, rhinorrhea, sneezing and sore throat.   Eyes:  Negative for redness.  Respiratory:  Negative for cough, chest tightness and  shortness of breath.   Cardiovascular:  Negative for chest pain and palpitations.  Gastrointestinal:  Negative for abdominal pain, constipation, diarrhea, nausea and vomiting.  Genitourinary:  Negative for dysuria and frequency.  Musculoskeletal:  Negative for arthralgias, back pain, joint swelling and neck pain.  Skin:  Negative for rash.  Neurological: Negative.  Negative for tremors and numbness.  Hematological:  Negative for adenopathy. Does not bruise/bleed easily.  Psychiatric/Behavioral:  Negative for behavioral problems (Depression), sleep disturbance and suicidal ideas. The patient is not nervous/anxious.    Vital Signs: BP (!) 138/98   Pulse 67   Temp 98.4 F (36.9 C)   Resp 16   Ht 6\' 2"  (1.88 m)   Wt 206 lb (93.4 kg)   SpO2 99%   BMI 26.45 kg/m    Physical Exam Vitals and nursing note reviewed.  Constitutional:      General: He is not in acute distress.    Appearance: He is well-developed. He is not diaphoretic.  HENT:     Head: Normocephalic and atraumatic.     Mouth/Throat:     Pharynx: No oropharyngeal exudate.  Eyes:     Pupils: Pupils are equal, round, and reactive to light.  Neck:     Thyroid: No thyromegaly.     Vascular: No JVD.     Trachea: No tracheal deviation.  Cardiovascular:     Rate and Rhythm: Normal rate and regular rhythm.     Heart sounds: Normal heart sounds. No murmur heard.   No friction rub. No gallop.  Pulmonary:     Effort: Pulmonary effort is normal. No respiratory distress.     Breath sounds: No wheezing or rales.  Chest:     Chest wall: No tenderness.  Abdominal:     General: Bowel sounds are normal.     Palpations: Abdomen is soft.  Musculoskeletal:        General: Normal range of motion.     Cervical back: Normal range of motion and neck supple.  Lymphadenopathy:     Cervical: No cervical adenopathy.  Skin:    General: Skin is warm and dry.  Neurological:     Mental Status: He is alert and oriented to person, place,  and time.     Cranial Nerves: No cranial nerve deficit.  Psychiatric:        Behavior: Behavior normal.        Thought Content: Thought content normal.        Judgment: Judgment normal.       Assessment/Plan: 1. Essential hypertension Patient has not been taking losartan but will start now. Discussed considering echo in future if diastolic not improving.  2. Testicular hypofunction Stable, continue current testosterone replacement   3. Hyperlipidemia, unspecified hyperlipidemia type Educated to restart crestor every other day if not daily - rosuvastatin (CRESTOR) 10 MG tablet; Take 1 tablet (10 mg total) by mouth daily.  Dispense: 90 tablet; Refill:  1  4. Pituitary microadenoma ([place]) Followed by endocrinology, pt states he should be having an updated MRI in Nov but has to call to schedule this   General Counseling: Stephen Lowery verbalizes understanding of the findings of todays visit and agrees with plan of treatment. I have discussed any further diagnostic evaluation that may be needed or ordered today. We also reviewed his medications today. he has been encouraged to call the office with any questions or concerns that should arise related to todays visit.    No orders of the defined types were placed in this encounter.   Meds ordered this encounter  Medications   rosuvastatin (CRESTOR) 10 MG tablet    Sig: Take 1 tablet (10 mg total) by mouth daily.    Dispense:  90 tablet    Refill:  1    This patient was seen by Drema Dallas, PA-C in collaboration with Dr. Clayborn Bigness as a part of collaborative care agreement.   Total time spent:30 Minutes Time spent includes review of chart, medications, test results, and follow up plan with the patient.      Dr Lavera Guise Internal medicine

## 2021-03-07 ENCOUNTER — Other Ambulatory Visit: Payer: Self-pay | Admitting: Physician Assistant

## 2021-03-14 ENCOUNTER — Other Ambulatory Visit: Payer: Self-pay | Admitting: Physician Assistant

## 2021-03-14 ENCOUNTER — Telehealth: Payer: Self-pay

## 2021-03-14 DIAGNOSIS — E291 Testicular hypofunction: Secondary | ICD-10-CM

## 2021-03-14 MED ORDER — TESTOSTERONE CYPIONATE 200 MG/ML IM SOLN: INTRAMUSCULAR | 0 refills | Status: DC

## 2021-03-15 NOTE — Telephone Encounter (Signed)
error 

## 2021-04-07 ENCOUNTER — Ambulatory Visit: Payer: BLUE CROSS/BLUE SHIELD | Admitting: Physician Assistant

## 2021-04-07 DIAGNOSIS — Z0289 Encounter for other administrative examinations: Secondary | ICD-10-CM

## 2021-04-17 ENCOUNTER — Ambulatory Visit: Payer: BLUE CROSS/BLUE SHIELD | Admitting: Physician Assistant

## 2021-04-17 ENCOUNTER — Other Ambulatory Visit: Payer: Self-pay

## 2021-04-17 ENCOUNTER — Encounter: Payer: Self-pay | Admitting: Physician Assistant

## 2021-04-17 DIAGNOSIS — D352 Benign neoplasm of pituitary gland: Secondary | ICD-10-CM | POA: Diagnosis not present

## 2021-04-17 DIAGNOSIS — E785 Hyperlipidemia, unspecified: Secondary | ICD-10-CM | POA: Diagnosis not present

## 2021-04-17 DIAGNOSIS — I10 Essential (primary) hypertension: Secondary | ICD-10-CM

## 2021-04-17 DIAGNOSIS — Z125 Encounter for screening for malignant neoplasm of prostate: Secondary | ICD-10-CM

## 2021-04-17 DIAGNOSIS — R5383 Other fatigue: Secondary | ICD-10-CM

## 2021-04-17 DIAGNOSIS — E291 Testicular hypofunction: Secondary | ICD-10-CM

## 2021-04-17 MED ORDER — ROSUVASTATIN CALCIUM 10 MG PO TABS: 10.0000 mg | ORAL_TABLET | Freq: Every day | ORAL | 1 refills | Status: DC

## 2021-04-17 NOTE — Progress Notes (Signed)
[facility] [place], [place] [address]  Internal MEDICINE  Office Visit Note  Patient Name: Stephen Lowery  [id number]  [id number]  Date of Service: 04/23/2021  Chief Complaint  Patient presents with   Follow-up   Hyperlipidemia    HPI Pt is here for routine follow up and has no complaints -Follows back up with endocrinology in Feb, will have an updated MRI for pituitary adenoma -He did not take his BP meds today which explain high BP and was counseled on med adherence -Will start checking BP at home and bring log to next visit or call if elevated at home -Will try setting alarm/reminder on phone for meds -Will need to update routine fasting labs before CPE  Current Medication: Outpatient Encounter Medications as of 04/17/2021  Medication Sig   B-D INTEGRA SYRINGE 22G X 1-1/2" 3 ML MISC To use for testosterone injections.   losartan (COZAAR) 25 MG tablet Take 1 tablet (25 mg total) by mouth daily.   minocycline (MINOCIN) 100 MG capsule TAKE 1 CAPSULE BY MOUTH DAILY FOR ACNE   testosterone cypionate (DEPOTESTOSTERONE CYPIONATE) 200 MG/ML injection Inject 42mL (200mg  total) into the muscle every 14 days   [DISCONTINUED] rosuvastatin (CRESTOR) 10 MG tablet Take 1 tablet (10 mg total) by mouth daily.   rosuvastatin (CRESTOR) 10 MG tablet Take 1 tablet (10 mg total) by mouth daily.   No facility-administered encounter medications on file as of 04/17/2021.    Surgical History: Past Surgical History:  Procedure Laterality Date   right hip replacement Right 06/17/2014    Medical History: Past Medical History:  Diagnosis Date   Anxiety    Hyperlipidemia     Family History: Family History  Problem Relation Age of Onset   Hypertension Mother    Hyperlipidemia Father    Hypertension Father     Social History   Socioeconomic History   Marital status: Married    Spouse name: Not on file   Number of children: Not on file   Years of education: Not  on file   Highest education level: Not on file  Occupational History   Not on file  Tobacco Use   Smoking status: Every Day    Packs/day: 1.00    Types: Cigarettes    Start date: 09/03/2017   Smokeless tobacco: Current    Types: Snuff   Tobacco comments:    Smokes about a pack every three days  Substance and Sexual Activity   Alcohol use: Yes    Comment: social   Drug use: No   Sexual activity: Not on file  Other Topics Concern   Not on file  Social History Narrative   Not on file   Social Determinants of Health   Financial Resource Strain: Not on file  Food Insecurity: Not on file  Transportation Needs: Not on file  Physical Activity: Not on file  Stress: Not on file  Social Connections: Not on file  Intimate Partner Violence: Not on file      Review of Systems  Constitutional:  Negative for chills, fatigue and unexpected weight change.  HENT:  Negative for congestion, postnasal drip, rhinorrhea, sneezing and sore throat.   Eyes:  Negative for redness.  Respiratory:  Negative for cough, chest tightness and shortness of breath.   Cardiovascular:  Negative for chest pain and palpitations.  Gastrointestinal:  Negative for abdominal pain, constipation, diarrhea, nausea and vomiting.  Genitourinary:  Negative for dysuria and frequency.  Musculoskeletal:  Negative  for arthralgias, back pain, joint swelling and neck pain.  Skin:  Negative for rash.  Neurological: Negative.  Negative for tremors and numbness.  Hematological:  Negative for adenopathy. Does not bruise/bleed easily.  Psychiatric/Behavioral:  Negative for behavioral problems (Depression), sleep disturbance and suicidal ideas. The patient is not nervous/anxious.    Vital Signs: BP (!) 154/100 Comment: 160/104   Pulse 72    Temp 98 F (36.7 C)    Resp 16    Ht 6\' 2"  (1.88 m)    Wt 214 lb 3.2 oz (97.2 kg)    SpO2 99%    BMI 27.50 kg/m    Physical Exam Vitals and nursing note reviewed.  Constitutional:       General: He is not in acute distress.    Appearance: He is well-developed. He is not diaphoretic.  HENT:     Head: Normocephalic and atraumatic.     Mouth/Throat:     Pharynx: No oropharyngeal exudate.  Eyes:     Pupils: Pupils are equal, round, and reactive to light.  Neck:     Thyroid: No thyromegaly.     Vascular: No JVD.     Trachea: No tracheal deviation.  Cardiovascular:     Rate and Rhythm: Normal rate and regular rhythm.     Heart sounds: Normal heart sounds. No murmur heard.   No friction rub. No gallop.  Pulmonary:     Effort: Pulmonary effort is normal. No respiratory distress.     Breath sounds: No wheezing or rales.  Chest:     Chest wall: No tenderness.  Abdominal:     General: Bowel sounds are normal.     Palpations: Abdomen is soft.  Musculoskeletal:        General: Normal range of motion.     Cervical back: Normal range of motion and neck supple.  Lymphadenopathy:     Cervical: No cervical adenopathy.  Skin:    General: Skin is warm and dry.  Neurological:     Mental Status: He is alert and oriented to person, place, and time.     Cranial Nerves: No cranial nerve deficit.  Psychiatric:        Behavior: Behavior normal.        Thought Content: Thought content normal.        Judgment: Judgment normal.       Assessment/Plan: 1. Essential hypertension Elevated in office due to patient not taking blood pressure medications.  Discussed medication adherence and need for setting alarm/reminder on phone to take medications daily as prescribed.  Will monitor BP at home and call office if continued elevation at home once taking medications  2. Pituitary microadenoma (Stephen Lowery) Followed by endocrinology with plan for updated MRI 7  3. Hyperlipidemia, unspecified hyperlipidemia type Continue Crestor and will update labs - rosuvastatin (CRESTOR) 10 MG tablet; Take 1 tablet (10 mg total) by mouth daily.  Dispense: 90 tablet; Refill: 1 - Lipid Panel With LDL/HDL  Ratio  4. Testicular hypofunction Will update labs - Testosterone,Free and Total  5. Screening for prostate cancer - PSA Total (Reflex To Free)  6. Other fatigue - CBC w/Diff/Platelet - Comprehensive metabolic panel - TSH + free T4   General Counseling: Stephen Lowery verbalizes understanding of the findings of todays visit and agrees with plan of treatment. I have discussed any further diagnostic evaluation that may be needed or ordered today. We also reviewed his medications today. he has been encouraged to call the office with any questions  or concerns that should arise related to todays visit.    Orders Placed This Encounter  Procedures   PSA Total (Reflex To Free)   CBC w/Diff/Platelet   Comprehensive metabolic panel   Lipid Panel With LDL/HDL Ratio   TSH + free T4   Testosterone,Free and Total    Meds ordered this encounter  Medications   rosuvastatin (CRESTOR) 10 MG tablet    Sig: Take 1 tablet (10 mg total) by mouth daily.    Dispense:  90 tablet    Refill:  1    This patient was seen by Drema Dallas, PA-C in collaboration with Dr. Clayborn Bigness as a part of collaborative care agreement.   Total time spent:30 Minutes Time spent includes review of chart, medications, test results, and follow up plan with the patient.      Dr Lavera Guise Internal medicine

## 2021-07-03 ENCOUNTER — Telehealth: Payer: Self-pay

## 2021-07-03 NOTE — Telephone Encounter (Signed)
Left vm to confirm 07/07/21 appointment-[name] ?

## 2021-07-07 ENCOUNTER — Encounter: Payer: BC Managed Care – PPO | Admitting: Physician Assistant

## 2021-07-27 ENCOUNTER — Other Ambulatory Visit: Payer: Self-pay

## 2021-07-27 ENCOUNTER — Telehealth: Payer: Self-pay

## 2021-07-27 ENCOUNTER — Other Ambulatory Visit: Payer: Self-pay | Admitting: Physician Assistant

## 2021-07-27 DIAGNOSIS — E291 Testicular hypofunction: Secondary | ICD-10-CM

## 2021-07-27 MED ORDER — TESTOSTERONE CYPIONATE 200 MG/ML IM SOLN: INTRAMUSCULAR | 0 refills | Status: DC

## 2021-07-27 MED ORDER — MINOCYCLINE HCL 100 MG PO CAPS: ORAL_CAPSULE | ORAL | 2 refills | Status: DC

## 2021-07-27 NOTE — Telephone Encounter (Signed)
Try to call pt voice mail full ?

## 2021-07-28 ENCOUNTER — Other Ambulatory Visit: Payer: Self-pay

## 2021-07-28 NOTE — Telephone Encounter (Signed)
Pt advised that we send med and advised to keep next visit otherwise we unable to do pt  ?

## 2021-08-07 ENCOUNTER — Other Ambulatory Visit: Payer: Self-pay | Admitting: Physician Assistant

## 2021-08-07 ENCOUNTER — Telehealth: Payer: Self-pay

## 2021-08-07 DIAGNOSIS — E291 Testicular hypofunction: Secondary | ICD-10-CM

## 2021-08-07 MED ORDER — TESTOSTERONE CYPIONATE 200 MG/ML IM SOLN: INTRAMUSCULAR | 0 refills | Status: DC

## 2021-08-07 NOTE — Telephone Encounter (Signed)
Spoke to pt and he was able to get prescription at [facility] but still required a PA, pharmacist did maybe a Goodrx and pt got 3 vials for $20 some dollars.  Pt asked to still do PA and we can see what happens. ?

## 2021-08-31 ENCOUNTER — Encounter: Payer: Self-pay | Admitting: Physician Assistant

## 2021-08-31 ENCOUNTER — Ambulatory Visit (INDEPENDENT_AMBULATORY_CARE_PROVIDER_SITE_OTHER): Payer: BLUE CROSS/BLUE SHIELD | Admitting: Physician Assistant

## 2021-08-31 VITALS — BP 158/107 | HR 75 | Temp 97.6°F | Resp 16 | Ht 74.0 in | Wt 210.8 lb

## 2021-08-31 DIAGNOSIS — E291 Testicular hypofunction: Secondary | ICD-10-CM

## 2021-08-31 DIAGNOSIS — I10 Essential (primary) hypertension: Secondary | ICD-10-CM | POA: Diagnosis not present

## 2021-08-31 DIAGNOSIS — B351 Tinea unguium: Secondary | ICD-10-CM

## 2021-08-31 DIAGNOSIS — R300 Dysuria: Secondary | ICD-10-CM

## 2021-08-31 DIAGNOSIS — E785 Hyperlipidemia, unspecified: Secondary | ICD-10-CM

## 2021-08-31 DIAGNOSIS — Z0001 Encounter for general adult medical examination with abnormal findings: Secondary | ICD-10-CM

## 2021-08-31 DIAGNOSIS — D352 Benign neoplasm of pituitary gland: Secondary | ICD-10-CM

## 2021-08-31 MED ORDER — LOSARTAN POTASSIUM 50 MG PO TABS: 50.0000 mg | ORAL_TABLET | Freq: Every day | ORAL | 1 refills | Status: DC

## 2021-08-31 NOTE — Progress Notes (Signed)
[facility] [place], [place] [address]  Internal MEDICINE  Office Visit Note  Patient Name: Stephen Lowery  [id number]  [id number]  Date of Service: 09/11/2021  Chief Complaint  Patient presents with   Annual Exam   Anxiety   Hyperlipidemia     HPI Pt is here for routine health maintenance examination -Just got back from [place], [place], and [place] -Saw endocrinology and will be having an MRI and if still stable was told he wouldn't need follow up for 5 years -Was not fasting prior to labs, ate just prior which explains elevated sugar and cholesterol -BP not checked at home and admits he has not been taking his BP or cholesterol meds regularly. He did take his 45m losartan this morning and bp still elevated on recheck 154/100. Will go ahead and increase to 535mlosartan daily and told he needs to take daily. Discussed we cannot continue to prescribe his testosterone if he does not start taking his BP meds daily as prescribed and should start crestor back daily as well. -right big toenail fungus for the past 1.5 years and topicals OTC have not helped. Will go ahead and send referral to podiatry  Current Medication: Outpatient Encounter Medications as of 08/31/2021  Medication Sig   B-D INTEGRA SYRINGE 22G X 1-1/2" 3 ML MISC To use for testosterone injections.   losartan (COZAAR) 50 MG tablet Take 1 tablet (50 mg total) by mouth daily.   minocycline (MINOCIN) 100 MG capsule Take 1 capsule by mouth daily for acne   rosuvastatin (CRESTOR) 10 MG tablet Take 1 tablet (10 mg total) by mouth daily.   testosterone cypionate (DEPOTESTOSTERONE CYPIONATE) 200 MG/ML injection Inject 60m82m200m42mtal) into the muscle every 14 days   [DISCONTINUED] losartan (COZAAR) 25 MG tablet Take 1 tablet (25 mg total) by mouth daily.   No facility-administered encounter medications on file as of 08/31/2021.    Surgical History: Past Surgical History:  Procedure Laterality Date    right hip replacement Right 06/17/2014    Medical History: Past Medical History:  Diagnosis Date   Anxiety    Hyperlipidemia     Family History: Family History  Problem Relation Age of Onset   Hypertension Mother    Hyperlipidemia Father    Hypertension Father       Review of Systems  Constitutional:  Negative for chills, fatigue and unexpected weight change.  HENT:  Negative for congestion, postnasal drip, rhinorrhea, sneezing and sore throat.   Eyes:  Negative for redness.  Respiratory:  Negative for cough, chest tightness and shortness of breath.   Cardiovascular:  Negative for chest pain and palpitations.  Gastrointestinal:  Negative for abdominal pain, constipation, diarrhea, nausea and vomiting.  Genitourinary:  Negative for dysuria and frequency.  Musculoskeletal:  Negative for arthralgias, back pain, joint swelling and neck pain.  Skin:  Negative for rash.       Toenail fungus  Neurological: Negative.  Negative for tremors and numbness.  Hematological:  Negative for adenopathy. Does not bruise/bleed easily.  Psychiatric/Behavioral:  Negative for behavioral problems (Depression), sleep disturbance and suicidal ideas. The patient is not nervous/anxious.      Vital Signs: BP (!) 158/107   Pulse 75   Temp 97.6 F (36.4 C)   Resp 16   Ht 6' 2" (1.88 m)   Wt 210 lb 12.8 oz (95.6 kg)   SpO2 99%   BMI 27.07 kg/m    Physical Exam Vitals and nursing note  reviewed.  Constitutional:      General: He is not in acute distress.    Appearance: He is well-developed. He is not diaphoretic.  HENT:     Head: Normocephalic and atraumatic.     Mouth/Throat:     Pharynx: No oropharyngeal exudate.  Eyes:     Pupils: Pupils are equal, round, and reactive to light.  Neck:     Thyroid: No thyromegaly.     Vascular: No JVD.     Trachea: No tracheal deviation.  Cardiovascular:     Rate and Rhythm: Normal rate and regular rhythm.     Heart sounds: Normal heart sounds. No  murmur heard.    No friction rub. No gallop.  Pulmonary:     Effort: Pulmonary effort is normal. No respiratory distress.     Breath sounds: No wheezing or rales.  Chest:     Chest wall: No tenderness.  Abdominal:     General: Bowel sounds are normal.     Palpations: Abdomen is soft.     Tenderness: There is no abdominal tenderness.  Musculoskeletal:        General: Normal range of motion.     Cervical back: Normal range of motion and neck supple.  Lymphadenopathy:     Cervical: No cervical adenopathy.  Skin:    General: Skin is warm and dry.     Comments: Right big toenail fungus present  Neurological:     Mental Status: He is alert and oriented to person, place, and time.     Cranial Nerves: No cranial nerve deficit.  Psychiatric:        Behavior: Behavior normal.        Thought Content: Thought content normal.        Judgment: Judgment normal.      LABS: Recent Results (from the past 2160 hour(s))  PSA Total (Reflex To Free)     Status: None   Collection Time: 08/30/21  2:25 PM  Result Value Ref Range   Prostate Specific Ag, Serum 1.7 0.0 - 4.0 ng/mL    Comment: Roche ECLIA methodology. According to the American Urological Association, Serum PSA should decrease and remain at undetectable levels after radical prostatectomy. The AUA defines biochemical recurrence as an initial PSA value 0.2 ng/mL or greater followed by a subsequent confirmatory PSA value 0.2 ng/mL or greater. Values obtained with different assay methods or kits cannot be used interchangeably. Results cannot be interpreted as absolute evidence of the presence or absence of malignant disease.    Reflex Criteria Comment     Comment: The percent free PSA is performed on a reflex basis only when the total PSA is between 4.0 and 10.0 ng/mL.   CBC w/Diff/Platelet     Status: Abnormal   Collection Time: 08/30/21  2:25 PM  Result Value Ref Range   WBC 6.3 3.4 - 10.8 x10E3/uL   RBC 5.36 4.14 - 5.80  x10E6/uL   Hemoglobin 17.4 13.0 - 17.7 g/dL   Hematocrit 50.7 37.5 - 51.0 %   MCV 95 79 - 97 fL   MCH 32.5 26.6 - 33.0 pg   MCHC 34.3 31.5 - 35.7 g/dL   RDW 13.1 11.6 - 15.4 %   Platelets 210 150 - 450 x10E3/uL   Neutrophils 53 Not Estab. %   Lymphs 31 Not Estab. %   Monocytes 7 Not Estab. %   Eos 8 Not Estab. %   Basos 1 Not Estab. %   Neutrophils Absolute 3.3  1.4 - 7.0 x10E3/uL   Lymphocytes Absolute 1.9 0.7 - 3.1 x10E3/uL   Monocytes Absolute 0.5 0.1 - 0.9 x10E3/uL   EOS (ABSOLUTE) 0.5 (H) 0.0 - 0.4 x10E3/uL   Basophils Absolute 0.1 0.0 - 0.2 x10E3/uL   Immature Granulocytes 0 Not Estab. %   Immature Grans (Abs) 0.0 0.0 - 0.1 x10E3/uL  Comprehensive metabolic panel     Status: Abnormal   Collection Time: 08/30/21  2:25 PM  Result Value Ref Range   Glucose 133 (H) 70 - 99 mg/dL   BUN 10 6 - 24 mg/dL   Creatinine, Ser 1.14 0.76 - 1.27 mg/dL   eGFR 81 >59 mL/min/1.73   BUN/Creatinine Ratio 9 9 - 20   Sodium 141 134 - 144 mmol/L   Potassium 4.1 3.5 - 5.2 mmol/L   Chloride 103 96 - 106 mmol/L   CO2 23 20 - 29 mmol/L   Calcium 9.4 8.7 - 10.2 mg/dL   Total Protein 6.4 6.0 - 8.5 g/dL   Albumin 4.7 4.0 - 5.0 g/dL   Globulin, Total 1.7 1.5 - 4.5 g/dL   Albumin/Globulin Ratio 2.8 (H) 1.2 - 2.2   Bilirubin Total 0.7 0.0 - 1.2 mg/dL   Alkaline Phosphatase 85 44 - 121 IU/L   AST 25 0 - 40 IU/L   ALT 36 0 - 44 IU/L  Lipid Panel With LDL/HDL Ratio     Status: Abnormal   Collection Time: 08/30/21  2:25 PM  Result Value Ref Range   Cholesterol, Total 187 100 - 199 mg/dL   Triglycerides 83 0 - 149 mg/dL   HDL 57 >39 mg/dL   VLDL Cholesterol Cal 15 5 - 40 mg/dL   LDL Chol Calc (NIH) 115 (H) 0 - 99 mg/dL   LDL/HDL Ratio 2.0 0.0 - 3.6 ratio    Comment:                                     LDL/HDL Ratio                                             Men  Women                               1/2 Avg.Risk  1.0    1.5                                   Avg.Risk  3.6    3.2                                 2X Avg.Risk  6.2    5.0                                3X Avg.Risk  8.0    6.1   TSH + free T4     Status: None   Collection Time: 08/30/21  2:25 PM  Result Value Ref Range   TSH 1.270 0.450 - 4.500 uIU/mL   Free T4 1.16 0.82 - 1.77 ng/dL  Testosterone,Free and Total     Status: None   Collection Time: 08/30/21  2:25 PM  Result Value Ref Range   Testosterone 578 264 - 916 ng/dL    Comment: Adult male reference interval is based on a population of healthy nonobese males (BMI <30) between 7 and 35 years old. [place], [place] [phone]. PMID: 24825003.    Testosterone, Free 20.5 6.8 - 21.5 pg/mL  UA/M w/rflx Culture, Routine     Status: Abnormal   Collection Time: 08/31/21 11:56 AM   Specimen: Urine   Urine  Result Value Ref Range   Specific Gravity, UA 1.015 1.005 - 1.030   pH, UA 6.0 5.0 - 7.5   Color, UA Yellow Yellow   Appearance Ur Cloudy (A) Clear   Leukocytes,UA Negative Negative   Protein,UA Negative Negative/Trace   Glucose, UA Negative Negative   Ketones, UA Negative Negative   RBC, UA Negative Negative   Bilirubin, UA Negative Negative   Urobilinogen, Ur 0.2 0.2 - 1.0 mg/dL   Nitrite, UA Negative Negative   Microscopic Examination Comment     Comment: Microscopic follows if indicated.   Microscopic Examination See below:     Comment: Microscopic was indicated and was performed.   Urinalysis Reflex Comment     Comment: This specimen will not reflex to a Urine Culture.  Microscopic Examination     Status: None   Collection Time: 08/31/21 11:56 AM   Urine  Result Value Ref Range   WBC, UA None seen 0 - 5 /hpf   RBC None seen 0 - 2 /hpf   Epithelial Cells (non renal) None seen 0 - 10 /hpf   Casts None seen None seen /lpf   Bacteria, UA None seen None seen/Few        Assessment/Plan: 1. Encounter for general adult medical examination with abnormal findings CPE performed and lab reviewed--though patient was not fasting  2.  Essential hypertension Elevated BP and inconsistency with taking meds. Will increase to 43m and explained importance of taking daily. Discussed we will not be able to continue testosterone therapy if he does not get his BP under control and additional testing may be required if continued elevation. Patient expressed understanding - losartan (COZAAR) 50 MG tablet; Take 1 tablet (50 mg total) by mouth daily.  Dispense: 90 tablet; Refill: 1  3. Hyperlipidemia, unspecified hyperlipidemia type Discussed the importance of taking his crestor and patient agreed to do better about taking this  4. Onychomycosis of great toe - Ambulatory referral to Podiatry  5. Testicular hypofunction Testosterone levels in range, may continue HRT  6. Pituitary microadenoma (HNovi Followed by endocrinology  7. Dysuria - UA/M w/rflx Culture, Routine - Microscopic Examination   General Counseling: Kiran verbalizes understanding of the findings of todays visit and agrees with plan of treatment. I have discussed any further diagnostic evaluation that may be needed or ordered today. We also reviewed his medications today. he has been encouraged to call the office with any questions or concerns that should arise related to todays visit.    Counseling:    Orders Placed This Encounter  Procedures   Microscopic Examination   UA/M w/rflx Culture, Routine   Ambulatory referral to Podiatry    Meds ordered this encounter  Medications   losartan (COZAAR) 50 MG tablet    Sig: Take 1 tablet (50 mg total) by mouth daily.    Dispense:  90 tablet    Refill:  1    This patient  was seen by Drema Dallas, PA-C in collaboration with Dr. Clayborn Bigness as a part of collaborative care agreement.  Total time spent:35 Minutes  Time spent includes review of chart, medications, test results, and follow up plan with the patient.     Lavera Guise, MD  Internal Medicine

## 2021-09-01 LAB — CBC WITH DIFFERENTIAL/PLATELET
Basophils Absolute: 0.1 10*3/uL (ref 0.0–0.2)
Basos: 1 %
EOS (ABSOLUTE): 0.5 10*3/uL — ABNORMAL HIGH (ref 0.0–0.4)
Eos: 8 %
Hematocrit: 50.7 % (ref 37.5–51.0)
Hemoglobin: 17.4 g/dL (ref 13.0–17.7)
Immature Grans (Abs): 0 10*3/uL (ref 0.0–0.1)
Immature Granulocytes: 0 %
Lymphocytes Absolute: 1.9 10*3/uL (ref 0.7–3.1)
Lymphs: 31 %
MCH: 32.5 pg (ref 26.6–33.0)
MCHC: 34.3 g/dL (ref 31.5–35.7)
MCV: 95 fL (ref 79–97)
Monocytes Absolute: 0.5 10*3/uL (ref 0.1–0.9)
Monocytes: 7 %
Neutrophils Absolute: 3.3 10*3/uL (ref 1.4–7.0)
Neutrophils: 53 %
Platelets: 210 10*3/uL (ref 150–450)
RBC: 5.36 x10E6/uL (ref 4.14–5.80)
RDW: 13.1 % (ref 11.6–15.4)
WBC: 6.3 10*3/uL (ref 3.4–10.8)

## 2021-09-01 LAB — COMPREHENSIVE METABOLIC PANEL
ALT: 36 IU/L (ref 0–44)
AST: 25 IU/L (ref 0–40)
Albumin/Globulin Ratio: 2.8 — ABNORMAL HIGH (ref 1.2–2.2)
Albumin: 4.7 g/dL (ref 4.0–5.0)
Alkaline Phosphatase: 85 IU/L (ref 44–121)
BUN/Creatinine Ratio: 9 (ref 9–20)
BUN: 10 mg/dL (ref 6–24)
Bilirubin Total: 0.7 mg/dL (ref 0.0–1.2)
CO2: 23 mmol/L (ref 20–29)
Calcium: 9.4 mg/dL (ref 8.7–10.2)
Chloride: 103 mmol/L (ref 96–106)
Creatinine, Ser: 1.14 mg/dL (ref 0.76–1.27)
Globulin, Total: 1.7 g/dL (ref 1.5–4.5)
Glucose: 133 mg/dL — ABNORMAL HIGH (ref 70–99)
Potassium: 4.1 mmol/L (ref 3.5–5.2)
Sodium: 141 mmol/L (ref 134–144)
Total Protein: 6.4 g/dL (ref 6.0–8.5)
eGFR: 81 mL/min/{1.73_m2} (ref >59)

## 2021-09-01 LAB — UA/M W/RFLX CULTURE, ROUTINE
Bilirubin, UA: NEGATIVE
Glucose, UA: NEGATIVE
Ketones, UA: NEGATIVE
Leukocytes,UA: NEGATIVE
Nitrite, UA: NEGATIVE
Protein,UA: NEGATIVE
RBC, UA: NEGATIVE
Specific Gravity, UA: 1.015 (ref 1.005–1.030)
Urobilinogen, Ur: 0.2 mg/dL (ref 0.2–1.0)
pH, UA: 6 (ref 5.0–7.5)

## 2021-09-01 LAB — MICROSCOPIC EXAMINATION
Bacteria, UA: NONE SEEN
Casts: NONE SEEN /lpf
Epithelial Cells (non renal): NONE SEEN /hpf (ref 0–10)
RBC, Urine: NONE SEEN /hpf (ref 0–2)
WBC, UA: NONE SEEN /hpf (ref 0–5)

## 2021-09-01 LAB — PSA TOTAL (REFLEX TO FREE): Prostate Specific Ag, Serum: 1.7 ng/mL (ref 0.0–4.0)

## 2021-09-01 LAB — LIPID PANEL WITH LDL/HDL RATIO
Cholesterol, Total: 187 mg/dL (ref 100–199)
HDL: 57 mg/dL (ref >39)
LDL Chol Calc (NIH): 115 mg/dL — ABNORMAL HIGH (ref 0–99)
LDL/HDL Ratio: 2 ratio (ref 0.0–3.6)
Triglycerides: 83 mg/dL (ref 0–149)
VLDL Cholesterol Cal: 15 mg/dL (ref 5–40)

## 2021-09-01 LAB — TSH+FREE T4
Free T4: 1.16 ng/dL (ref 0.82–1.77)
TSH: 1.27 u[IU]/mL (ref 0.450–4.500)

## 2021-09-01 LAB — TESTOSTERONE,FREE AND TOTAL
Testosterone, Free: 20.5 pg/mL (ref 6.8–21.5)
Testosterone: 578 ng/dL (ref 264–916)

## 2021-09-11 ENCOUNTER — Other Ambulatory Visit: Payer: Self-pay | Admitting: Physician Assistant

## 2021-09-18 ENCOUNTER — Telehealth: Payer: Self-pay

## 2021-09-18 NOTE — Telephone Encounter (Signed)
Podiatry referral redirected from Epic to manually faxed to [facility] due to insurance-[name]

## 2021-09-19 ENCOUNTER — Telehealth: Payer: Self-pay

## 2021-09-20 ENCOUNTER — Other Ambulatory Visit: Payer: Self-pay | Admitting: Physician Assistant

## 2021-09-20 DIAGNOSIS — E291 Testicular hypofunction: Secondary | ICD-10-CM

## 2021-09-20 MED ORDER — TESTOSTERONE CYPIONATE 200 MG/ML IM SOLN: INTRAMUSCULAR | 1 refills | Status: DC

## 2021-09-20 NOTE — Telephone Encounter (Signed)
error 

## 2021-10-26 ENCOUNTER — Ambulatory Visit: Payer: BLUE CROSS/BLUE SHIELD | Admitting: Physician Assistant

## 2021-11-10 ENCOUNTER — Ambulatory Visit (INDEPENDENT_AMBULATORY_CARE_PROVIDER_SITE_OTHER): Payer: BLUE CROSS/BLUE SHIELD | Admitting: Physician Assistant

## 2021-11-10 ENCOUNTER — Encounter: Payer: Self-pay | Admitting: Physician Assistant

## 2021-11-10 ENCOUNTER — Telehealth: Payer: Self-pay

## 2021-11-10 DIAGNOSIS — I10 Essential (primary) hypertension: Secondary | ICD-10-CM

## 2021-11-10 DIAGNOSIS — F17210 Nicotine dependence, cigarettes, uncomplicated: Secondary | ICD-10-CM

## 2021-11-10 DIAGNOSIS — E291 Testicular hypofunction: Secondary | ICD-10-CM | POA: Diagnosis not present

## 2021-11-10 DIAGNOSIS — E785 Hyperlipidemia, unspecified: Secondary | ICD-10-CM | POA: Diagnosis not present

## 2021-11-10 MED ORDER — LOSARTAN POTASSIUM 50 MG PO TABS: 50.0000 mg | ORAL_TABLET | Freq: Every day | ORAL | 1 refills | Status: DC

## 2021-11-10 MED ORDER — ROSUVASTATIN CALCIUM 10 MG PO TABS: 10.0000 mg | ORAL_TABLET | Freq: Every day | ORAL | 1 refills | Status: DC

## 2021-11-10 MED ORDER — BUPROPION HCL ER (XL) 150 MG PO TB24: 150.0000 mg | ORAL_TABLET | Freq: Every day | ORAL | 2 refills | Status: DC

## 2021-11-10 MED ORDER — TESTOSTERONE CYPIONATE 200 MG/ML IM SOLN: INTRAMUSCULAR | 2 refills | Status: DC

## 2021-11-10 MED ORDER — MINOCYCLINE HCL 100 MG PO CAPS
ORAL_CAPSULE | ORAL | 1 refills | Status: DC
Start: 2021-11-10 — End: 2022-02-02

## 2021-11-10 NOTE — Progress Notes (Signed)
[facility] [place], [place] [address]  Internal MEDICINE  Office Visit Note  Patient Name: Stephen Lowery  [id number]  [id number]  Date of Service: 11/10/2021  Chief Complaint  Patient presents with   Follow-up   Anxiety    HPI Pt is here for routine follow up -Has been better about taking his medications, both BP and cholesterol medication and BP is improved -Wants to quit smoking, currently at 1-1.5ppd. He states he has quit before for up to a year but then restarted. He thinks he does better quitting out right rather than tapering. Discussed trying wellbutrin to help with cessation -Needs to follow up on podiatry referral, hasnt heard anything still. He also mentions his wife is in need of referral as well and found that there is a [facility] office that takes their insurance  -Due for refills today  Current Medication: Outpatient Encounter Medications as of 11/10/2021  Medication Sig   B-D INTEGRA SYRINGE 22G X 1-1/2" 3 ML MISC To use for testosterone injections.   buPROPion (WELLBUTRIN XL) 150 MG 24 hr tablet Take 1 tablet (150 mg total) by mouth daily.   [DISCONTINUED] losartan (COZAAR) 50 MG tablet Take 1 tablet (50 mg total) by mouth daily.   [DISCONTINUED] minocycline (MINOCIN) 100 MG capsule TAKE ONE CAPSULE BY MOUTH ONE TIME DAILY FOR ACNE   [DISCONTINUED] rosuvastatin (CRESTOR) 10 MG tablet Take 1 tablet (10 mg total) by mouth daily.   [DISCONTINUED] testosterone cypionate (DEPOTESTOSTERONE CYPIONATE) 200 MG/ML injection Inject 62m ('200mg'$  total) into the muscle every 14 days   losartan (COZAAR) 50 MG tablet Take 1 tablet (50 mg total) by mouth daily.   minocycline (MINOCIN) 100 MG capsule TAKE ONE CAPSULE BY MOUTH ONE TIME DAILY FOR ACNE   rosuvastatin (CRESTOR) 10 MG tablet Take 1 tablet (10 mg total) by mouth daily.   testosterone cypionate (DEPOTESTOSTERONE CYPIONATE) 200 MG/ML injection Inject 184m('200mg'$  total) into the muscle every 14 days   No  facility-administered encounter medications on file as of 11/10/2021.    Surgical History: Past Surgical History:  Procedure Laterality Date   right hip replacement Right 06/17/2014    Medical History: Past Medical History:  Diagnosis Date   Anxiety    Hyperlipidemia     Family History: Family History  Problem Relation Age of Onset   Hypertension Mother    Hyperlipidemia Father    Hypertension Father     Social History   Socioeconomic History   Marital status: Married    Spouse name: Not on file   Number of children: Not on file   Years of education: Not on file   Highest education level: Not on file  Occupational History   Not on file  Tobacco Use   Smoking status: Every Day    Packs/day: 1.00    Types: Cigarettes    Start date: 09/03/2017   Smokeless tobacco: Current    Types: Snuff   Tobacco comments:    Smokes about a pack every three days  Substance and Sexual Activity   Alcohol use: Yes    Comment: social   Drug use: No   Sexual activity: Not on file  Other Topics Concern   Not on file  Social History Narrative   Not on file   Social Determinants of Health   Financial Resource Strain: Not on file  Food Insecurity: Not on file  Transportation Needs: Not on file  Physical Activity: Not on file  Stress: Not on  file  Social Connections: Not on file  Intimate Partner Violence: Not on file      Review of Systems  Constitutional:  Negative for chills, fatigue and unexpected weight change.  HENT:  Negative for congestion, postnasal drip, rhinorrhea, sneezing and sore throat.   Eyes:  Negative for redness.  Respiratory:  Negative for cough, chest tightness and shortness of breath.   Cardiovascular:  Negative for chest pain and palpitations.  Gastrointestinal:  Negative for abdominal pain, constipation, diarrhea, nausea and vomiting.  Genitourinary:  Negative for dysuria and frequency.  Musculoskeletal:  Negative for arthralgias, back pain, joint  swelling and neck pain.  Skin:  Negative for rash.       Toenail fungus  Neurological: Negative.  Negative for tremors and numbness.  Hematological:  Negative for adenopathy. Does not bruise/bleed easily.  Psychiatric/Behavioral:  Negative for behavioral problems (Depression), sleep disturbance and suicidal ideas. The patient is not nervous/anxious.     Vital Signs: BP 138/84   Pulse 83   Temp 97.8 F (36.6 C)   Resp 16   Ht '6\' 2"'$  (1.88 m)   Wt 213 lb (96.6 kg)   SpO2 99%   BMI 27.35 kg/m    Physical Exam Vitals and nursing note reviewed.  Constitutional:      General: He is not in acute distress.    Appearance: He is well-developed. He is not diaphoretic.  HENT:     Head: Normocephalic and atraumatic.     Mouth/Throat:     Pharynx: No oropharyngeal exudate.  Eyes:     Pupils: Pupils are equal, round, and reactive to light.  Neck:     Thyroid: No thyromegaly.     Vascular: No JVD.     Trachea: No tracheal deviation.  Cardiovascular:     Rate and Rhythm: Normal rate and regular rhythm.     Heart sounds: Normal heart sounds. No murmur heard.    No friction rub. No gallop.  Pulmonary:     Effort: Pulmonary effort is normal. No respiratory distress.     Breath sounds: No wheezing or rales.  Chest:     Chest wall: No tenderness.  Abdominal:     General: Bowel sounds are normal.     Palpations: Abdomen is soft.  Musculoskeletal:        General: Normal range of motion.     Cervical back: Normal range of motion and neck supple.  Lymphadenopathy:     Cervical: No cervical adenopathy.  Skin:    General: Skin is warm and dry.  Neurological:     Mental Status: He is alert and oriented to person, place, and time.     Cranial Nerves: No cranial nerve deficit.  Psychiatric:        Behavior: Behavior normal.        Thought Content: Thought content normal.        Judgment: Judgment normal.        Assessment/Plan: 1. Essential hypertension Stable, continue losartan  daily - losartan (COZAAR) 50 MG tablet; Take 1 tablet (50 mg total) by mouth daily.  Dispense: 90 tablet; Refill: 1  2. Hyperlipidemia, unspecified hyperlipidemia type Continue crestor daily, will be due to recheck labs after next visit - rosuvastatin (CRESTOR) 10 MG tablet; Take 1 tablet (10 mg total) by mouth daily.  Dispense: 90 tablet; Refill: 1  3. Testicular hypofunction Refills sent, due for refills after next visit - testosterone cypionate (DEPOTESTOSTERONE CYPIONATE) 200 MG/ML injection; Inject 7m ('200mg'$  total)  into the muscle every 14 days  Dispense: 4 mL; Refill: 2  4. Cigarette smoker Will start on wellbutrin to aid in smoking cessation - buPROPion (WELLBUTRIN XL) 150 MG 24 hr tablet; Take 1 tablet (150 mg total) by mouth daily.  Dispense: 30 tablet; Refill: 2   General Counseling: Juanpablo verbalizes understanding of the findings of todays visit and agrees with plan of treatment. I have discussed any further diagnostic evaluation that may be needed or ordered today. We also reviewed his medications today. he has been encouraged to call the office with any questions or concerns that should arise related to todays visit.    No orders of the defined types were placed in this encounter.   Meds ordered this encounter  Medications   losartan (COZAAR) 50 MG tablet    Sig: Take 1 tablet (50 mg total) by mouth daily.    Dispense:  90 tablet    Refill:  1   minocycline (MINOCIN) 100 MG capsule    Sig: TAKE ONE CAPSULE BY MOUTH ONE TIME DAILY FOR ACNE    Dispense:  30 capsule    Refill:  1   rosuvastatin (CRESTOR) 10 MG tablet    Sig: Take 1 tablet (10 mg total) by mouth daily.    Dispense:  90 tablet    Refill:  1   testosterone cypionate (DEPOTESTOSTERONE CYPIONATE) 200 MG/ML injection    Sig: Inject 42m ('200mg'$  total) into the muscle every 14 days    Dispense:  4 mL    Refill:  2   buPROPion (WELLBUTRIN XL) 150 MG 24 hr tablet    Sig: Take 1 tablet (150 mg total) by mouth  daily.    Dispense:  30 tablet    Refill:  2    This patient was seen by LDrema Dallas PA-C in collaboration with Dr. FClayborn Bignessas a part of collaborative care agreement.   Total time spent:30 Minutes Time spent includes review of chart, medications, test results, and follow up plan with the patient.      Dr FLavera GuiseInternal medicine

## 2021-11-10 NOTE — Telephone Encounter (Signed)
Patient has not heard back from [facility] podiatry to schedule appointment. I called their office, they stated a letter was sent to Lauren 09/19/21 stating they do not treat his diagnosis, would need to be referred elsewhere-[name]

## 2021-11-14 ENCOUNTER — Other Ambulatory Visit: Payer: Self-pay | Admitting: Physician Assistant

## 2021-11-14 DIAGNOSIS — B351 Tinea unguium: Secondary | ICD-10-CM

## 2021-11-17 ENCOUNTER — Telehealth: Payer: Self-pay

## 2021-11-17 NOTE — Telephone Encounter (Signed)
[facility] podiatry denied referral. Stated they do not treat patient's condition. Faxed referral to [facility] dermatology-[name]

## 2021-12-18 ENCOUNTER — Other Ambulatory Visit: Payer: Self-pay | Admitting: Physician Assistant

## 2021-12-18 DIAGNOSIS — E291 Testicular hypofunction: Secondary | ICD-10-CM

## 2021-12-18 DIAGNOSIS — E785 Hyperlipidemia, unspecified: Secondary | ICD-10-CM

## 2022-02-01 ENCOUNTER — Other Ambulatory Visit: Payer: Self-pay | Admitting: Physician Assistant

## 2022-02-09 ENCOUNTER — Ambulatory Visit: Payer: BLUE CROSS/BLUE SHIELD | Admitting: Physician Assistant

## 2022-02-26 ENCOUNTER — Encounter: Payer: Self-pay | Admitting: *Deleted

## 2022-02-26 ENCOUNTER — Emergency Department: Payer: BLUE CROSS/BLUE SHIELD

## 2022-02-26 ENCOUNTER — Other Ambulatory Visit: Payer: Self-pay

## 2022-02-26 ENCOUNTER — Encounter: Payer: Self-pay | Admitting: Physician Assistant

## 2022-02-26 ENCOUNTER — Ambulatory Visit (INDEPENDENT_AMBULATORY_CARE_PROVIDER_SITE_OTHER): Payer: BLUE CROSS/BLUE SHIELD | Admitting: Physician Assistant

## 2022-02-26 VITALS — BP 146/100 | HR 78 | Temp 98.4°F | Resp 16 | Ht 74.0 in | Wt 215.8 lb

## 2022-02-26 DIAGNOSIS — I10 Essential (primary) hypertension: Secondary | ICD-10-CM | POA: Diagnosis not present

## 2022-02-26 DIAGNOSIS — F17210 Nicotine dependence, cigarettes, uncomplicated: Secondary | ICD-10-CM | POA: Insufficient documentation

## 2022-02-26 DIAGNOSIS — G479 Sleep disorder, unspecified: Secondary | ICD-10-CM | POA: Diagnosis not present

## 2022-02-26 DIAGNOSIS — R519 Headache, unspecified: Secondary | ICD-10-CM | POA: Diagnosis present

## 2022-02-26 DIAGNOSIS — Z79899 Other long term (current) drug therapy: Secondary | ICD-10-CM | POA: Insufficient documentation

## 2022-02-26 LAB — CBC WITH DIFFERENTIAL/PLATELET
Abs Immature Granulocytes: 0.02 10*3/uL (ref 0.00–0.07)
Basophils Absolute: 0.1 10*3/uL (ref 0.0–0.1)
Basophils Relative: 1 %
Eosinophils Absolute: 0.9 10*3/uL — ABNORMAL HIGH (ref 0.0–0.5)
Eosinophils Relative: 9 %
HCT: 47.1 % (ref 39.0–52.0)
Hemoglobin: 15.7 g/dL (ref 13.0–17.0)
Immature Granulocytes: 0 %
Lymphocytes Relative: 29 %
Lymphs Abs: 2.9 10*3/uL (ref 0.7–4.0)
MCH: 31.9 pg (ref 26.0–34.0)
MCHC: 33.3 g/dL (ref 30.0–36.0)
MCV: 95.7 fL (ref 80.0–100.0)
Monocytes Absolute: 0.7 10*3/uL (ref 0.1–1.0)
Monocytes Relative: 7 %
Neutro Abs: 5.5 10*3/uL (ref 1.7–7.7)
Neutrophils Relative %: 54 %
Platelets: 224 10*3/uL (ref 150–400)
RBC: 4.92 MIL/uL (ref 4.22–5.81)
RDW: 13 % (ref 11.5–15.5)
WBC: 10 10*3/uL (ref 4.0–10.5)
nRBC: 0 % (ref 0.0–0.2)

## 2022-02-26 LAB — BASIC METABOLIC PANEL
Anion gap: 9 (ref 5–15)
BUN: 13 mg/dL (ref 6–20)
CO2: 26 mmol/L (ref 22–32)
Calcium: 9.5 mg/dL (ref 8.9–10.3)
Chloride: 104 mmol/L (ref 98–111)
Creatinine, Ser: 1.03 mg/dL (ref 0.61–1.24)
GFR, Estimated: >60 mL/min (ref >60)
Glucose, Bld: 137 mg/dL — ABNORMAL HIGH (ref 70–99)
Potassium: 3.5 mmol/L (ref 3.5–5.1)
Sodium: 139 mmol/L (ref 135–145)

## 2022-02-26 LAB — TROPONIN I (HIGH SENSITIVITY): Troponin I (High Sensitivity): 4 ng/L (ref <18)

## 2022-02-26 NOTE — Progress Notes (Signed)
[facility] [place], [place] [address]  Internal MEDICINE  Office Visit Note  Patient Name: Stephen Lowery  [id number]  [id number]  Date of Service: 03/06/2022  Chief Complaint  Patient presents with   Follow-up   Hypertension   Hyperlipidemia   Sleeping Problem    Has a hard time staying asleep   Medication Problem    Medication for smoking is not helping   Quality Metric Gaps    TDAP    HPI Pt is here for routine follow up with his wife -staying asleep has been challenging recently  -Goes to sleep at 10 or 11pm and will wake at 4am and can't get back to sleep. Feels rested usually. May nap 39mns if needed, but otherwise feels well. He denies snoring or gasping in sleep. Denies feeling stressed or anxious. -Taking tylenol PM can help, but he actually feels more groggy after taking something -Discussed active thinking techniques, but also may need to consider retesting for OSA as apnea could be waking him and then he can't get back to sleep. Could consider starting trazodone if not improving or if increasing fatigue. -Missed BP meds for 2 days but has been back on it again the past few days. BP still elevated on recheck in office and will increase to 1.5 tabs losartan('75mg'$ ) and have him monitor. Feels well, no headache or blurred vision -may need to repeat sleep study or consider echo if continued elevated BP -Taking crestor regularly -working on smoking cessation still. May try nicotine gum, is on wellbutrin already  Current Medication: Outpatient Encounter Medications as of 02/26/2022  Medication Sig   B-D INTEGRA SYRINGE 22G X 1-1/2" 3 ML MISC To use for testosterone injections.   buPROPion (WELLBUTRIN XL) 150 MG 24 hr tablet Take 1 tablet (150 mg total) by mouth daily.   losartan (COZAAR) 50 MG tablet Take 1 tablet (50 mg total) by mouth daily.   minocycline (MINOCIN) 100 MG capsule TAKE ONE CAPSULE BY MOUTH ONE TIME DAILY FOR ACNE   rosuvastatin  (CRESTOR) 10 MG tablet TAKE ONE TABLET BY MOUTH ONE TIME DAILY   testosterone cypionate (DEPOTESTOSTERONE CYPIONATE) 200 MG/ML injection INJECT 1ML INTO THE MUSCLE EVERY 14 DAYS   No facility-administered encounter medications on file as of 02/26/2022.    Surgical History: Past Surgical History:  Procedure Laterality Date   right hip replacement Right 06/17/2014    Medical History: Past Medical History:  Diagnosis Date   Anxiety    Hyperlipidemia     Family History: Family History  Problem Relation Age of Onset   Hypertension Mother    Hyperlipidemia Father    Hypertension Father     Social History   Socioeconomic History   Marital status: Married    Spouse name: Not on file   Number of children: Not on file   Years of education: Not on file   Highest education level: Not on file  Occupational History   Not on file  Tobacco Use   Smoking status: Every Day    Packs/day: 1.00    Types: Cigarettes    Start date: 09/03/2017   Smokeless tobacco: Current    Types: Snuff   Tobacco comments:    1 pack daily  Substance and Sexual Activity   Alcohol use: Yes    Comment: social   Drug use: No   Sexual activity: Not on file  Other Topics Concern   Not on file  Social History Narrative  Not on file   Social Determinants of Health   Financial Resource Strain: Not on file  Food Insecurity: Not on file  Transportation Needs: Not on file  Physical Activity: Not on file  Stress: Not on file  Social Connections: Not on file  Intimate Partner Violence: Not on file      Review of Systems  Constitutional:  Negative for chills, fatigue and unexpected weight change.  HENT:  Negative for congestion, postnasal drip, rhinorrhea, sneezing and sore throat.   Eyes:  Negative for redness.  Respiratory:  Negative for cough, chest tightness and shortness of breath.   Cardiovascular:  Negative for chest pain and palpitations.  Gastrointestinal:  Negative for abdominal pain,  constipation, diarrhea, nausea and vomiting.  Genitourinary:  Negative for dysuria and frequency.  Musculoskeletal:  Negative for arthralgias, back pain, joint swelling and neck pain.  Skin:  Negative for rash.  Neurological: Negative.  Negative for tremors and numbness.  Hematological:  Negative for adenopathy. Does not bruise/bleed easily.  Psychiatric/Behavioral:  Positive for sleep disturbance. Negative for behavioral problems (Depression) and suicidal ideas. The patient is not nervous/anxious.     Vital Signs: BP (!) 146/100 Comment: 149/106  Pulse 78   Temp 98.4 F (36.9 C)   Resp 16   Ht '6\' 2"'$  (1.88 m)   Wt 215 lb 12.8 oz (97.9 kg)   SpO2 99%   BMI 27.71 kg/m    Physical Exam Vitals and nursing note reviewed.  Constitutional:      General: He is not in acute distress.    Appearance: He is well-developed. He is not diaphoretic.  HENT:     Head: Normocephalic and atraumatic.     Mouth/Throat:     Pharynx: No oropharyngeal exudate.  Eyes:     Pupils: Pupils are equal, round, and reactive to light.  Neck:     Thyroid: No thyromegaly.     Vascular: No JVD.     Trachea: No tracheal deviation.  Cardiovascular:     Rate and Rhythm: Normal rate and regular rhythm.     Heart sounds: Normal heart sounds. No murmur heard.    No friction rub. No gallop.  Pulmonary:     Effort: Pulmonary effort is normal. No respiratory distress.     Breath sounds: No wheezing or rales.  Chest:     Chest wall: No tenderness.  Abdominal:     General: Bowel sounds are normal.     Palpations: Abdomen is soft.  Musculoskeletal:        General: Normal range of motion.     Cervical back: Normal range of motion and neck supple.  Lymphadenopathy:     Cervical: No cervical adenopathy.  Skin:    General: Skin is warm and dry.  Neurological:     Mental Status: He is alert and oriented to person, place, and time.     Cranial Nerves: No cranial nerve deficit.  Psychiatric:        Behavior:  Behavior normal.        Thought Content: Thought content normal.        Judgment: Judgment normal.        Assessment/Plan: 1. Essential hypertension Elevated in office, will increase to 1.5 tabs losartan and monitor closely  2. Sleep trouble Trouble maintaining sleep after 6 hours, though feels rested. May try active thinking techniques. If worsening could consider trazodone or retest for OSA as he has a hx of this  3. Cigarette smoker Smoking  cessation counseling: Pt acknowledges the risks of long term smoking, she will try to quite smoking. Options for different medications including nicotine products, chewing gum, patch etc, Wellbutrin and Chantix is discussed Goal and date of compete cessation is discussed Total time spent in smoking cessation is 10 min.    General Counseling: Cloys verbalizes understanding of the findings of todays visit and agrees with plan of treatment. I have discussed any further diagnostic evaluation that may be needed or ordered today. We also reviewed his medications today. he has been encouraged to call the office with any questions or concerns that should arise related to todays visit.    No orders of the defined types were placed in this encounter.   No orders of the defined types were placed in this encounter.   This patient was seen by Drema Dallas, PA-C in collaboration with Dr. Clayborn Bigness as a part of collaborative care agreement.   Total time spent:30 Minutes Time spent includes review of chart, medications, test results, and follow up plan with the patient.      Dr Lavera Guise Internal medicine

## 2022-02-26 NOTE — ED Provider Triage Note (Signed)
Emergency Medicine Provider Triage Evaluation Note  Stephen Lowery , a 46 y.o. male  was evaluated in triage.  Pt complains of HTN today. It was 180/111. His PCP took his BP today and noticed it was high and told him to double his meds. No CP/SOB.  No visual changes. Reports he missed his BP meds Thursday, Friday and Saturday.  Review of Systems  Positive: headache Negative: Vomiting, CP, SOB, vision changes  Physical Exam  There were no vitals taken for this visit. Gen:   Awake, no distress   Resp:  Normal effort  MSK:   Moves extremities without difficulty  Other:    Medical Decision Making  Medically screening exam initiated at 10:45 PM.  Appropriate orders placed.  Stephen Lowery was informed that the remainder of the evaluation will be completed by another provider, this initial triage assessment does not replace that evaluation, and the importance of remaining in the ED until their evaluation is complete.     Stephen Old, PA-C 02/26/22 2249

## 2022-02-26 NOTE — ED Triage Notes (Addendum)
Pt reports htn today   pt taking bp meds.  Pt also reports a headache  no n/v   pt has missed doses of bp meds recently.   pt alert  speech clear.

## 2022-02-27 ENCOUNTER — Emergency Department
Admission: EM | Admit: 2022-02-27 | Discharge: 2022-02-27 | Disposition: A | Discharge status: Home / Self Care | Payer: BLUE CROSS/BLUE SHIELD | Attending: Emergency Medicine | Admitting: Emergency Medicine

## 2022-02-27 DIAGNOSIS — I10 Essential (primary) hypertension: Secondary | ICD-10-CM

## 2022-02-27 LAB — URINE DRUG SCREEN, QUALITATIVE (ARMC ONLY)
Amphetamines, Ur Screen: NOT DETECTED
Barbiturates, Ur Screen: NOT DETECTED
Benzodiazepine, Ur Scrn: NOT DETECTED
Cannabinoid 50 Ng, Ur ~~LOC~~: NOT DETECTED
Cocaine Metabolite,Ur ~~LOC~~: NOT DETECTED
MDMA (Ecstasy)Ur Screen: NOT DETECTED
Methadone Scn, Ur: NOT DETECTED
Opiate, Ur Screen: NOT DETECTED
Phencyclidine (PCP) Ur S: NOT DETECTED
Tricyclic, Ur Screen: NOT DETECTED

## 2022-02-27 LAB — URINALYSIS, ROUTINE W REFLEX MICROSCOPIC
Bilirubin Urine: NEGATIVE
Glucose, UA: NEGATIVE mg/dL
Hgb urine dipstick: NEGATIVE
Ketones, ur: NEGATIVE mg/dL
Leukocytes,Ua: NEGATIVE
Nitrite: NEGATIVE
Protein, ur: NEGATIVE mg/dL
Specific Gravity, Urine: 1.015 (ref 1.005–1.030)
pH: 6 (ref 5.0–8.0)

## 2022-02-27 LAB — TROPONIN I (HIGH SENSITIVITY): Troponin I (High Sensitivity): 4 ng/L (ref <18)

## 2022-02-27 LAB — D-DIMER, QUANTITATIVE: D-Dimer, Quant: <0.27 ug/mL-FEU (ref 0.00–0.50)

## 2022-02-27 MED ORDER — CLONIDINE HCL 0.1 MG PO TABS: 0.1000 mg | ORAL_TABLET | Freq: Two times a day (BID) | ORAL | 0 refills | Status: DC | PRN

## 2022-02-27 MED ORDER — LOSARTAN POTASSIUM 50 MG PO TABS
50.0000 mg | ORAL_TABLET | Freq: Once | ORAL | Status: AC
  Administered 2022-02-27: 50 mg via ORAL
  Filled 2022-02-27: qty 1

## 2022-02-27 MED ORDER — LABETALOL HCL 5 MG/ML IV SOLN
20.0000 mg | Freq: Once | INTRAVENOUS | Status: AC
  Administered 2022-02-27: 20 mg via INTRAVENOUS
  Filled 2022-02-27: qty 4

## 2022-02-27 MED ORDER — LORAZEPAM 2 MG/ML IJ SOLN
1.0000 mg | Freq: Once | INTRAMUSCULAR | Status: AC
  Administered 2022-02-27: 1 mg via INTRAVENOUS
  Filled 2022-02-27: qty 1

## 2022-02-27 NOTE — ED Provider Notes (Signed)
[facility] Provider Note    Event Date/Time   First MD Initiated Contact with Patient 02/27/22 0246     (approximate)   History   Hypertension   HPI  Stephen Lowery is a 46 y.o. male who present to the ED from home with a chief complaint of elevated blood pressure.  Patient has a history of hypertension on losartan x10 years.  States he often forgets to take his medicines; forgot to take it 12/1 and 12/2 and resumed 12/3 and 12/4.  Reports mild headache.  Presents because he had a pulmonology follow-up this morning and noted elevated blood pressure at that time.  Denies associated chest pain, shortness of breath, nausea, vomiting or dizziness.  Denies recent fever/chills, cough, dysuria or diarrhea.  Travels often.  Denies increased work or life stress.     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 Authorizing Provider  B-D INTEGRA  SYRINGE 22G X 1-1/2" 3 ML MISC To use for testosterone injections. 11/03/19   Ronnell Freshwater, NP  buPROPion (WELLBUTRIN XL) 150 MG 24 hr tablet Take 1 tablet (150 mg total) by mouth daily. 11/10/21   McDonough, Si Gaul, PA-C  losartan (COZAAR) 50 MG tablet Take 1 tablet (50 mg total) by mouth daily. 11/10/21   McDonough, Si Gaul, PA-C  minocycline (MINOCIN) 100 MG capsule TAKE ONE CAPSULE BY MOUTH ONE TIME DAILY FOR ACNE 02/02/22   McDonough, Lauren K, PA-C  rosuvastatin (CRESTOR) 10 MG tablet TAKE ONE TABLET BY MOUTH ONE TIME DAILY 12/18/21   McDonough, Si Gaul, PA-C  testosterone cypionate (DEPOTESTOSTERONE CYPIONATE) 200 MG/ML injection INJECT 1ML INTO THE MUSCLE EVERY 14 DAYS 12/18/21   McDonough, Si Gaul, PA-C     Allergies  Patient has no known allergies.   Family History   Family History  Problem Relation Age of Onset   Hypertension Mother    Hyperlipidemia Father    Hypertension Father      Physical Exam  Triage Vital Signs: ED Triage Vitals  Enc Vitals Group     BP 02/26/22 2248 (!) 180/118     Pulse Rate 02/26/22 2248 89     Resp 02/26/22 2248 20     Temp 02/26/22 2248 98.2 F (36.8 C)     Temp Source 02/26/22 2248 Oral     SpO2 02/26/22 2248 100 %     Weight 02/26/22 2247 208 lb (94.3 kg)     Height 02/26/22 2246  $'6\' 2"'B$  (1.88 m)     Head Circumference --      Peak Flow --      Pain Score 02/26/22 2246 3     Pain Loc --      Pain Edu? --      Excl. in [place]? --     Updated Vital Signs: BP (!) 136/90 (BP Location: Right Arm)   Pulse 76   Temp 97.8 F (36.6 C) (Oral)   Resp 15   Ht '6\' 2"'$  (1.88 m)   Wt 94.3 kg   SpO2 100%   BMI 26.71 kg/m    General: Awake, no distress.  CV:  RRR.  Good peripheral perfusion.  Resp:  Normal effort.  CTA B. Abd:  Nontender.  No distention.  Other:  Supple neck without meningismus.  No carotid bruits.  Bilateral calves are nontender and nonswollen.  Alert and oriented x3.  CN II-XII grossly intact.  5/5 motor strength  and sensation all extremities. MAEx4.   ED Results / Procedures / Treatments  Labs (all labs ordered are listed, but only abnormal results are displayed) Labs Reviewed  CBC WITH DIFFERENTIAL/PLATELET - Abnormal; Notable for the following components:      Result Value   Eosinophils Absolute 0.9 (*)    All other components within normal limits  BASIC METABOLIC PANEL - Abnormal; Notable for the following components:   Glucose, Bld 137 (*)    All other components within normal limits  URINALYSIS, ROUTINE W REFLEX MICROSCOPIC - Abnormal; Notable for the following components:   Color, Urine YELLOW (*)    APPearance CLEAR (*)    All other components within normal limits  URINE DRUG SCREEN, QUALITATIVE ([facility] ONLY)  D-DIMER, QUANTITATIVE  TROPONIN I (HIGH SENSITIVITY)  TROPONIN I (HIGH SENSITIVITY)     EKG  ED ECG REPORT I, Lowanda Cashaw J, the attending physician, personally viewed and interpreted this ECG.   Date: 02/27/2022  EKG Time: 2251  Rate: 85  Rhythm: normal sinus rhythm  Axis: Normal  Intervals:none  ST&T Change: Nonspecific    RADIOLOGY I have independently visualized and interpreted patient's CT and chest x-ray as well as noted the radiology interpretation:  CT head: No ICH  Chest x-ray: No acute cardiopulmonary process  Official radiology report(s): CT HEAD WO CONTRAST (5MM)  Result Date: 02/26/2022 CLINICAL DATA:  Headache EXAM: CT HEAD WITHOUT CONTRAST TECHNIQUE: Contiguous axial images were obtained from the base of the skull through the vertex without intravenous contrast. RADIATION DOSE REDUCTION: This exam was performed according to the departmental dose-optimization program which includes automated exposure control, adjustment of the mA and/or kV according to patient size and/or use of iterative reconstruction technique. COMPARISON:  MRI brain 06/26/2017 FINDINGS: Brain: No evidence of acute infarction, hemorrhage, hydrocephalus, extra-axial collection or  mass lesion/mass effect. Vascular: No hyperdense vessel or unexpected calcification. Skull: Normal. Negative for fracture or focal lesion. Sinuses/Orbits: No acute finding. Other: None. IMPRESSION: No acute intracranial pathology. Electronically Signed   By: Ronney Asters M.D.   On: 02/26/2022 23:29   DG Chest 2 View  Result Date: 02/26/2022 CLINICAL DATA:  Hypertension EXAM: CHEST - 2 VIEW COMPARISON:  08/21/2011 FINDINGS: The heart size and mediastinal contours are within normal limits. Both lungs are clear. The visualized skeletal structures are unremarkable. IMPRESSION: No active cardiopulmonary disease. Electronically Signed   By: Rolm Baptise M.D.   On: 02/26/2022 23:23     PROCEDURES:  Critical Care performed: No  .1-3 Lead EKG Interpretation  Performed by: Paulette Blanch, MD Authorized by: Paulette Blanch, MD     Interpretation: normal     ECG rate:  85   ECG rate assessment: normal     Rhythm: sinus rhythm     Ectopy: none     Conduction: normal   Comments:     Patient placed on cardiac monitor to evaluate for arrhythmias    MEDICATIONS ORDERED IN ED: Medications  losartan (COZAAR) tablet 50 mg (50 mg Oral Given 02/27/22 0129)  LORazepam (ATIVAN) injection 1 mg (1 mg Intravenous Given 02/27/22 0327)  labetalol (NORMODYNE) injection 20 mg (20 mg Intravenous Given 02/27/22 0327)     IMPRESSION / MDM / ASSESSMENT AND PLAN / ED COURSE  I reviewed the triage vital signs and the nursing notes.                             46 year old male presenting with elevated blood pressure.  Differential diagnosis includes but is not limited to medication noncompliance, ACS, infectious, metabolic etiologies, etc.  I have personally reviewed patient's records and note his pulmonology office visit from yesterday.  He had a documented blood pressure at that visit of 149/106.  Patient's presentation is most consistent with acute presentation with potential threat to life or bodily function.  The  patient is on the cardiac monitor to evaluate for evidence of arrhythmia and/or significant heart rate changes.  Laboratory results demonstrate normal WBC of 10, normal electrolytes, 2 sets of negative troponin, negative UA and UDS.  CT head and chest x-ray unremarkable.  Patient had dose of losartan over 1 hour ago and blood pressure remains elevated.  Will obtain D-dimer given patient's frequent traveling, administer IV labetalol and Ativan.  Will reassess.  Clinical Course as of 02/27/22 0440  Tue Feb 27, 2022  0438 BP 136/90.  D-dimer is negative.  Patient feels fine and is eager for discharge home.  We will keep him at current losartan dosage and prescribe as needed clonidine with strict parameters.  He will follow-up closely with his PCP.  Strict return precautions given.  Patient and spouse verbalized understanding agree with plan of care. [JS]    Clinical Course User Index [JS] Paulette Blanch, MD     FINAL CLINICAL IMPRESSION(S) / ED DIAGNOSES   Final diagnoses:  Hypertension, unspecified type     Rx / DC Orders   ED Discharge Orders     None        Note:  This document was prepared using Dragon voice recognition software and may include unintentional dictation errors.   Paulette Blanch, MD 02/27/22 [phone]

## 2022-02-27 NOTE — Discharge Instructions (Signed)
Continue Losartan at current dosage.  You may take Clonidine 0.5 mg up to 2 times per day for the following: Top blood pressure number > 180 Bottom blood pressure number > 100 Return to the ER for worsening symptoms, persistent vomiting, difficulty breathing or other concerns.

## 2022-03-01 ENCOUNTER — Encounter: Payer: Self-pay | Admitting: Physician Assistant

## 2022-03-01 ENCOUNTER — Ambulatory Visit (INDEPENDENT_AMBULATORY_CARE_PROVIDER_SITE_OTHER): Payer: BLUE CROSS/BLUE SHIELD | Admitting: Physician Assistant

## 2022-03-01 VITALS — BP 144/100 | HR 75 | Temp 98.3°F | Resp 16 | Ht 74.0 in | Wt 214.6 lb

## 2022-03-01 DIAGNOSIS — I10 Essential (primary) hypertension: Secondary | ICD-10-CM | POA: Diagnosis not present

## 2022-03-01 MED ORDER — AMLODIPINE BESYLATE 5 MG PO TABS: 5.0000 mg | ORAL_TABLET | Freq: Every day | ORAL | 1 refills | Status: DC

## 2022-03-01 NOTE — Progress Notes (Signed)
[facility] [place], [place] [address]  Internal MEDICINE  Office Visit Note  Patient Name: Stephen Lowery  [id number]  [id number]  Date of Service: 03/10/2022  Chief Complaint  Patient presents with   Follow-up    ED follow up   Hypertension    HPI Pt is here for ED follow up for HTN -He was seen in office on Monday 12/4 with slightly elevated BP and was advised to take extra 1/2 tab losartan ('75mg'$ ) and to monitor. Unfortunately he started checking later that evening and found his BP to be very elevated in 544B systolic and started having headache therefore went to ED where BP was confirmed to be very elevated. -He does report worrying about his BP and did discuss sometimes thinking and worrying about it can cause it to elevate some too -ED gave him ativan and IV labetalol to lower his BP and he was discharged with clonidine BID prn for diastolic greater than 201. In the ED he also had blood work which was normal as well as chest xray and CT head that were unremarkable -BP has been elevated a little still, but improved from before. He did take 2 clonidine on Tuesday and 1 yesterday. He started doubling his losartan and taking 2 tabs which he confirmed he has '50mg'$  tabs and therefore is taking '100mg'$  total. -Did not take any clonidine today. Did have coffee, energy drink, and did smoke today which is his usual routine so that we can see what his BP is doing with his regular routine. -Is smoking 1ppd and is working to quit, looking in to nicotine gum to help -he does have hx of OSA but he stopped wearing it when he lost weight and wasn't snoring anymore and didn't wake fatigued. Never retested to confirm he didn't have it though and discussed he likely still has osa just less severe and should really retest. He is going to retry wearing his machine and see how he feels and then will decide whether he wants to retest. Discussed elevated BP and poor sleep continuity can  be signs of OSA and can lead to headaches as well. Also discussed considering ordering echo in future as well.  Current Medication: Outpatient Encounter Medications as of 03/01/2022  Medication Sig   amLODipine (NORVASC) 5 MG tablet Take 1 tablet (5 mg total) by mouth daily.   B-D INTEGRA SYRINGE 22G X 1-1/2" 3 ML MISC To use for testosterone injections.   buPROPion (WELLBUTRIN XL) 150 MG 24 hr tablet Take 1 tablet (150 mg total) by mouth daily.   cloNIDine (CATAPRES) 0.1 MG tablet Take 1 tablet (0.1 mg total) by mouth 2 (two) times daily as needed.   losartan (COZAAR) 50 MG tablet Take 1 tablet (50 mg total) by mouth daily.   minocycline (MINOCIN) 100 MG capsule TAKE ONE CAPSULE BY MOUTH ONE TIME DAILY FOR ACNE   rosuvastatin (CRESTOR) 10 MG tablet TAKE ONE TABLET BY MOUTH ONE TIME DAILY   testosterone cypionate (DEPOTESTOSTERONE CYPIONATE) 200 MG/ML injection INJECT 1ML INTO THE MUSCLE EVERY 14 DAYS   No facility-administered encounter medications on file as of 03/01/2022.    Surgical History: Past Surgical History:  Procedure Laterality Date   right hip replacement Right 06/17/2014    Medical History: Past Medical History:  Diagnosis Date   Anxiety    Hyperlipidemia     Family History: Family History  Problem Relation Age of Onset   Hypertension Mother    Hyperlipidemia  Father    Hypertension Father     Social History   Socioeconomic History   Marital status: Married    Spouse name: Not on file   Number of children: Not on file   Years of education: Not on file   Highest education level: Not on file  Occupational History   Not on file  Tobacco Use   Smoking status: Every Day    Packs/day: 1.00    Types: Cigarettes    Start date: 09/03/2017   Smokeless tobacco: Current    Types: Snuff   Tobacco comments:    1 pack daily  Substance and Sexual Activity   Alcohol use: Yes    Comment: social   Drug use: No   Sexual activity: Not on file  Other Topics Concern    Not on file  Social History Narrative   Not on file   Social Determinants of Health   Financial Resource Strain: Not on file  Food Insecurity: Not on file  Transportation Needs: Not on file  Physical Activity: Not on file  Stress: Not on file  Social Connections: Not on file  Intimate Partner Violence: Not on file      Review of Systems  Constitutional:  Negative for chills, fatigue and unexpected weight change.  HENT:  Negative for congestion, postnasal drip, rhinorrhea, sneezing and sore throat.   Eyes:  Negative for redness.  Respiratory:  Negative for cough, chest tightness and shortness of breath.   Cardiovascular:  Negative for chest pain and palpitations.  Gastrointestinal:  Negative for abdominal pain, constipation, diarrhea, nausea and vomiting.  Genitourinary:  Negative for dysuria and frequency.  Musculoskeletal:  Negative for arthralgias, back pain, joint swelling and neck pain.  Skin:  Negative for rash.  Neurological: Negative.  Negative for tremors and numbness.  Hematological:  Negative for adenopathy. Does not bruise/bleed easily.  Psychiatric/Behavioral:  Positive for sleep disturbance. Negative for behavioral problems (Depression) and suicidal ideas. The patient is not nervous/anxious.     Vital Signs: BP (!) 144/100   Pulse 75   Temp 98.3 F (36.8 C)   Resp 16   Ht '6\' 2"'$  (1.88 m)   Wt 214 lb 9.6 oz (97.3 kg)   SpO2 99%   BMI 27.55 kg/m    Physical Exam Vitals and nursing note reviewed.  Constitutional:      General: He is not in acute distress.    Appearance: He is well-developed. He is not diaphoretic.  HENT:     Head: Normocephalic and atraumatic.     Mouth/Throat:     Pharynx: No oropharyngeal exudate.  Eyes:     Pupils: Pupils are equal, round, and reactive to light.  Neck:     Thyroid: No thyromegaly.     Vascular: No JVD.     Trachea: No tracheal deviation.  Cardiovascular:     Rate and Rhythm: Normal rate and regular rhythm.      Heart sounds: Normal heart sounds. No murmur heard.    No friction rub. No gallop.  Pulmonary:     Effort: Pulmonary effort is normal. No respiratory distress.     Breath sounds: No wheezing or rales.  Chest:     Chest wall: No tenderness.  Abdominal:     General: Bowel sounds are normal.     Palpations: Abdomen is soft.  Musculoskeletal:        General: Normal range of motion.     Cervical back: Normal range of motion and  neck supple.  Lymphadenopathy:     Cervical: No cervical adenopathy.  Skin:    General: Skin is warm and dry.  Neurological:     Mental Status: He is alert and oriented to person, place, and time.     Cranial Nerves: No cranial nerve deficit.  Psychiatric:        Behavior: Behavior normal.        Thought Content: Thought content normal.        Judgment: Judgment normal.        Assessment/Plan: 1. Essential hypertension Will continue '100mg'$  losartan and add amlodipine daily. He does have clonidine to use only as needed. Continue to monitor - amLODipine (NORVASC) 5 MG tablet; Take 1 tablet (5 mg total) by mouth daily.  Dispense: 90 tablet; Refill: 1   General Counseling: Jakub verbalizes understanding of the findings of todays visit and agrees with plan of treatment. I have discussed any further diagnostic evaluation that may be needed or ordered today. We also reviewed his medications today. he has been encouraged to call the office with any questions or concerns that should arise related to todays visit.    No orders of the defined types were placed in this encounter.   Meds ordered this encounter  Medications   amLODipine (NORVASC) 5 MG tablet    Sig: Take 1 tablet (5 mg total) by mouth daily.    Dispense:  90 tablet    Refill:  1    This patient was seen by Drema Dallas, PA-C in collaboration with Dr. Clayborn Bigness as a part of collaborative care agreement.   Total time spent:30 Minutes Time spent includes review of chart, medications,  test results, and follow up plan with the patient.      Dr Lavera Guise Internal medicine

## 2022-03-02 ENCOUNTER — Encounter: Payer: Self-pay | Admitting: Physician Assistant

## 2022-03-12 ENCOUNTER — Other Ambulatory Visit: Payer: Self-pay | Admitting: Physician Assistant

## 2022-03-12 DIAGNOSIS — F17210 Nicotine dependence, cigarettes, uncomplicated: Secondary | ICD-10-CM

## 2022-04-09 ENCOUNTER — Ambulatory Visit (INDEPENDENT_AMBULATORY_CARE_PROVIDER_SITE_OTHER): Payer: BLUE CROSS/BLUE SHIELD | Admitting: Physician Assistant

## 2022-04-09 ENCOUNTER — Encounter: Payer: Self-pay | Admitting: Physician Assistant

## 2022-04-09 VITALS — BP 120/82 | HR 74 | Temp 98.0°F | Resp 16 | Ht 74.0 in | Wt 219.0 lb

## 2022-04-09 DIAGNOSIS — I10 Essential (primary) hypertension: Secondary | ICD-10-CM

## 2022-04-09 DIAGNOSIS — E785 Hyperlipidemia, unspecified: Secondary | ICD-10-CM

## 2022-04-09 DIAGNOSIS — F17210 Nicotine dependence, cigarettes, uncomplicated: Secondary | ICD-10-CM | POA: Diagnosis not present

## 2022-04-09 DIAGNOSIS — E291 Testicular hypofunction: Secondary | ICD-10-CM

## 2022-04-09 MED ORDER — TESTOSTERONE CYPIONATE 100 MG/ML IM SOLN: 100.0000 mg | INTRAMUSCULAR | 2 refills | Status: DC

## 2022-04-09 MED ORDER — LOSARTAN POTASSIUM 100 MG PO TABS: 100.0000 mg | ORAL_TABLET | Freq: Every day | ORAL | 1 refills | Status: DC

## 2022-04-09 MED ORDER — ROSUVASTATIN CALCIUM 10 MG PO TABS: 10.0000 mg | ORAL_TABLET | Freq: Every day | ORAL | 1 refills | Status: DC

## 2022-04-09 MED ORDER — TESTOSTERONE CYPIONATE 200 MG/ML IM SOLN: INTRAMUSCULAR | 1 refills | Status: DC

## 2022-04-09 MED ORDER — AMLODIPINE BESYLATE 10 MG PO TABS: 10.0000 mg | ORAL_TABLET | Freq: Every day | ORAL | 1 refills | Status: DC

## 2022-04-09 MED ORDER — BUPROPION HCL ER (XL) 150 MG PO TB24: 150.0000 mg | ORAL_TABLET | Freq: Every day | ORAL | 1 refills | Status: DC

## 2022-04-09 MED ORDER — TETANUS-DIPHTH-ACELL PERTUSSIS 5-2.5-18.5 LF-MCG/0.5 IM SUSP: 0.5000 mL | Freq: Once | INTRAMUSCULAR | 0 refills | Status: AC

## 2022-04-09 NOTE — Progress Notes (Signed)
[facility] [place], [place] [address]  Internal MEDICINE  Office Visit Note  Patient Name: Stephen Lowery  [id number]  [id number]  Date of Service: 04/14/2022  Chief Complaint  Patient presents with   Follow-up   Hypertension   Hyperlipidemia    HPI Pt is here for routine follow up -Taking '100mg'$  Losartan and has been taking '10mg'$  amlodipine and BP has been stable -he has not been drinking alcohol for the past 2 weeks. Still working on smoking cessation though. Forgets to bring nicotine gum with him to help -consider lower dose testosterone weekly instead of q14 due to medication wearing off in second week. We are due to check his labs  Current Medication: Outpatient Encounter Medications as of 04/09/2022  Medication Sig   amLODipine (NORVASC) 10 MG tablet Take 1 tablet (10 mg total) by mouth daily.   B-D INTEGRA SYRINGE 22G X 1-1/2" 3 ML MISC To use for testosterone injections.   cloNIDine (CATAPRES) 0.1 MG tablet Take 1 tablet (0.1 mg total) by mouth 2 (two) times daily as needed.   losartan (COZAAR) 100 MG tablet Take 1 tablet (100 mg total) by mouth daily.   minocycline (MINOCIN) 100 MG capsule TAKE ONE CAPSULE BY MOUTH ONE TIME DAILY FOR ACNE   [DISCONTINUED] amLODipine (NORVASC) 5 MG tablet Take 1 tablet (5 mg total) by mouth daily.   [DISCONTINUED] buPROPion (WELLBUTRIN XL) 150 MG 24 hr tablet TAKE ONE TABLET BY MOUTH ONE TIME DAILY   [DISCONTINUED] losartan (COZAAR) 50 MG tablet Take 1 tablet (50 mg total) by mouth daily.   [DISCONTINUED] rosuvastatin (CRESTOR) 10 MG tablet TAKE ONE TABLET BY MOUTH ONE TIME DAILY   [DISCONTINUED] Tdap (BOOSTRIX) 5-2.5-18.5 LF-MCG/0.5 injection Inject 0.5 mLs into the muscle once.   [DISCONTINUED] testosterone cypionate (DEPOTESTOSTERONE CYPIONATE) 200 MG/ML injection INJECT 1ML INTO THE MUSCLE EVERY 14 DAYS   [DISCONTINUED] testosterone cypionate (DEPOTESTOTERONE CYPIONATE) 100 MG/ML injection Inject 1 mL (100 mg  total) into the muscle once a week. For IM use only   buPROPion (WELLBUTRIN XL) 150 MG 24 hr tablet Take 1 tablet (150 mg total) by mouth daily.   rosuvastatin (CRESTOR) 10 MG tablet Take 1 tablet (10 mg total) by mouth daily.   [EXPIRED] Tdap (BOOSTRIX) 5-2.5-18.5 LF-MCG/0.5 injection Inject 0.5 mLs into the muscle once for 1 dose.   [DISCONTINUED] testosterone cypionate (DEPOTESTOSTERONE CYPIONATE) 200 MG/ML injection INJECT 1ML INTO THE MUSCLE EVERY 14 DAYS   No facility-administered encounter medications on file as of 04/09/2022.    Surgical History: Past Surgical History:  Procedure Laterality Date   right hip replacement Right 06/17/2014    Medical History: Past Medical History:  Diagnosis Date   Anxiety    Hyperlipidemia    Hypertension     Family History: Family History  Problem Relation Age of Onset   Hypertension Mother    Hyperlipidemia Father    Hypertension Father     Social History   Socioeconomic History   Marital status: Married    Spouse name: Not on file   Number of children: Not on file   Years of education: Not on file   Highest education level: Not on file  Occupational History   Not on file  Tobacco Use   Smoking status: Every Day    Packs/day: 1.00    Types: Cigarettes    Start date: 09/03/2017   Smokeless tobacco: Current    Types: Snuff   Tobacco comments:    1 pack daily  Substance and Sexual Activity   Alcohol use: Yes    Comment: social   Drug use: No   Sexual activity: Not on file  Other Topics Concern   Not on file  Social History Narrative   Not on file   Social Determinants of Health   Financial Resource Strain: Not on file  Food Insecurity: Not on file  Transportation Needs: Not on file  Physical Activity: Not on file  Stress: Not on file  Social Connections: Not on file  Intimate Partner Violence: Not on file      Review of Systems  Constitutional:  Negative for chills, fatigue and unexpected weight change.   HENT:  Negative for congestion, postnasal drip, rhinorrhea, sneezing and sore throat.   Eyes:  Negative for redness.  Respiratory:  Negative for cough, chest tightness and shortness of breath.   Cardiovascular:  Negative for chest pain and palpitations.  Gastrointestinal:  Negative for abdominal pain, constipation, diarrhea, nausea and vomiting.  Genitourinary:  Negative for dysuria and frequency.  Musculoskeletal:  Negative for arthralgias, back pain, joint swelling and neck pain.  Skin:  Negative for rash.  Neurological: Negative.  Negative for tremors and numbness.  Hematological:  Negative for adenopathy. Does not bruise/bleed easily.  Psychiatric/Behavioral:  Negative for behavioral problems (Depression), sleep disturbance and suicidal ideas. The patient is not nervous/anxious.     Vital Signs: BP 120/82   Pulse 74   Temp 98 F (36.7 C)   Resp 16   Ht '6\' 2"'$  (1.88 m)   Wt 219 lb (99.3 kg)   SpO2 98%   BMI 28.12 kg/m    Physical Exam Vitals and nursing note reviewed.  Constitutional:      General: He is not in acute distress.    Appearance: He is well-developed. He is not diaphoretic.  HENT:     Head: Normocephalic and atraumatic.     Mouth/Throat:     Pharynx: No oropharyngeal exudate.  Eyes:     Pupils: Pupils are equal, round, and reactive to light.  Neck:     Thyroid: No thyromegaly.     Vascular: No JVD.     Trachea: No tracheal deviation.  Cardiovascular:     Rate and Rhythm: Normal rate and regular rhythm.     Heart sounds: Normal heart sounds. No murmur heard.    No friction rub. No gallop.  Pulmonary:     Effort: Pulmonary effort is normal. No respiratory distress.     Breath sounds: No wheezing or rales.  Chest:     Chest wall: No tenderness.  Abdominal:     General: Bowel sounds are normal.     Palpations: Abdomen is soft.  Musculoskeletal:        General: Normal range of motion.     Cervical back: Normal range of motion and neck supple.   Lymphadenopathy:     Cervical: No cervical adenopathy.  Skin:    General: Skin is warm and dry.  Neurological:     Mental Status: He is alert and oriented to person, place, and time.     Cranial Nerves: No cranial nerve deficit.  Psychiatric:        Behavior: Behavior normal.        Thought Content: Thought content normal.        Judgment: Judgment normal.        Assessment/Plan: 1. Essential hypertension Stable, continue current medications - amLODipine (NORVASC) 10 MG tablet; Take 1 tablet (10 mg total)  by mouth daily.  Dispense: 90 tablet; Refill: 1 - losartan (COZAAR) 100 MG tablet; Take 1 tablet (100 mg total) by mouth daily.  Dispense: 90 tablet; Refill: 1  2. Testicular hypofunction Will recheck labs. Refill sent with adjusted dosing for weekly injection at lower dose and will see if this helps symptoms - Testosterone,Free and Total  3. Hyperlipidemia, unspecified hyperlipidemia type Continue crestor and update labs - Lipid Panel With LDL/HDL Ratio - rosuvastatin (CRESTOR) 10 MG tablet; Take 1 tablet (10 mg total) by mouth daily.  Dispense: 90 tablet; Refill: 1  4. Cigarette smoker - buPROPion (WELLBUTRIN XL) 150 MG 24 hr tablet; Take 1 tablet (150 mg total) by mouth daily.  Dispense: 90 tablet; Refill: 1   General Counseling: Airon verbalizes understanding of the findings of todays visit and agrees with plan of treatment. I have discussed any further diagnostic evaluation that may be needed or ordered today. We also reviewed his medications today. he has been encouraged to call the office with any questions or concerns that should arise related to todays visit.    Orders Placed This Encounter  Procedures   Testosterone,Free and Total   Lipid Panel With LDL/HDL Ratio    Meds ordered this encounter  Medications   Tdap (BOOSTRIX) 5-2.5-18.5 LF-MCG/0.5 injection    Sig: Inject 0.5 mLs into the muscle once for 1 dose.    Dispense:  0.5 mL    Refill:  0    amLODipine (NORVASC) 10 MG tablet    Sig: Take 1 tablet (10 mg total) by mouth daily.    Dispense:  90 tablet    Refill:  1   losartan (COZAAR) 100 MG tablet    Sig: Take 1 tablet (100 mg total) by mouth daily.    Dispense:  90 tablet    Refill:  1   buPROPion (WELLBUTRIN XL) 150 MG 24 hr tablet    Sig: Take 1 tablet (150 mg total) by mouth daily.    Dispense:  90 tablet    Refill:  1   rosuvastatin (CRESTOR) 10 MG tablet    Sig: Take 1 tablet (10 mg total) by mouth daily.    Dispense:  90 tablet    Refill:  1   DISCONTD: testosterone cypionate (DEPOTESTOTERONE CYPIONATE) 100 MG/ML injection    Sig: Inject 1 mL (100 mg total) into the muscle once a week. For IM use only    Dispense:  4 mL    Refill:  2    With needles   DISCONTD: testosterone cypionate (DEPOTESTOSTERONE CYPIONATE) 200 MG/ML injection    Sig: INJECT 1ML INTO THE MUSCLE EVERY 14 DAYS    Dispense:  3 mL    Refill:  1    With needles    This patient was seen by Drema Dallas, PA-C in collaboration with Dr. Clayborn Bigness as a part of collaborative care agreement.   Total time spent:35 Minutes Time spent includes review of chart, medications, test results, and follow up plan with the patient.      Dr Lavera Guise Internal medicine

## 2022-04-10 ENCOUNTER — Telehealth: Payer: Self-pay

## 2022-04-10 NOTE — Telephone Encounter (Signed)
PA was sent for testosterone cypionate.

## 2022-04-11 ENCOUNTER — Other Ambulatory Visit: Payer: Self-pay | Admitting: Physician Assistant

## 2022-04-11 ENCOUNTER — Telehealth: Payer: Self-pay

## 2022-04-11 DIAGNOSIS — E291 Testicular hypofunction: Secondary | ICD-10-CM

## 2022-04-11 MED ORDER — TESTOSTERONE CYPIONATE 200 MG/ML IM SOLN: INTRAMUSCULAR | 1 refills | Status: DC

## 2022-04-11 NOTE — Telephone Encounter (Signed)
Done

## 2022-04-12 ENCOUNTER — Telehealth: Payer: Self-pay

## 2022-04-12 NOTE — Telephone Encounter (Signed)
Patient was denied for the testosterone cypionate but patient will pay for it out of pocket.

## 2022-04-19 LAB — TESTOSTERONE,FREE AND TOTAL
Testosterone, Free: 12.2 pg/mL (ref 6.8–21.5)
Testosterone: 342 ng/dL (ref 264–916)

## 2022-04-19 LAB — LIPID PANEL WITH LDL/HDL RATIO
Cholesterol, Total: 144 mg/dL (ref 100–199)
HDL: 45 mg/dL (ref >39)
LDL Chol Calc (NIH): 76 mg/dL (ref 0–99)
LDL/HDL Ratio: 1.7 ratio (ref 0.0–3.6)
Triglycerides: 133 mg/dL (ref 0–149)
VLDL Cholesterol Cal: 23 mg/dL (ref 5–40)

## 2022-04-24 ENCOUNTER — Other Ambulatory Visit: Payer: Self-pay | Admitting: Physician Assistant

## 2022-04-24 DIAGNOSIS — E291 Testicular hypofunction: Secondary | ICD-10-CM

## 2022-04-27 ENCOUNTER — Telehealth: Payer: Self-pay

## 2022-04-27 NOTE — Telephone Encounter (Signed)
-----   Message from Mylinda Latina, PA-C sent at 04/27/2022  1:32 PM EST ----- Please let him know that his testosterone replacement is still adequate and his lipids improving, continue current meds

## 2022-04-27 NOTE — Telephone Encounter (Signed)
Spoke with patient regarding lab results. 

## 2022-07-10 ENCOUNTER — Other Ambulatory Visit: Payer: Self-pay

## 2022-07-10 DIAGNOSIS — E291 Testicular hypofunction: Secondary | ICD-10-CM

## 2022-07-11 MED ORDER — TESTOSTERONE CYPIONATE 200 MG/ML IM SOLN: INTRAMUSCULAR | 1 refills | Status: DC

## 2022-07-16 ENCOUNTER — Encounter: Payer: Self-pay | Admitting: Physician Assistant

## 2022-07-16 ENCOUNTER — Ambulatory Visit (INDEPENDENT_AMBULATORY_CARE_PROVIDER_SITE_OTHER): Payer: BLUE CROSS/BLUE SHIELD | Admitting: Physician Assistant

## 2022-07-16 VITALS — BP 142/80 | HR 95 | Temp 97.8°F | Resp 16 | Ht 74.0 in | Wt 221.0 lb

## 2022-07-16 DIAGNOSIS — I10 Essential (primary) hypertension: Secondary | ICD-10-CM | POA: Diagnosis not present

## 2022-07-16 DIAGNOSIS — Z125 Encounter for screening for malignant neoplasm of prostate: Secondary | ICD-10-CM

## 2022-07-16 DIAGNOSIS — R5383 Other fatigue: Secondary | ICD-10-CM

## 2022-07-16 DIAGNOSIS — F17210 Nicotine dependence, cigarettes, uncomplicated: Secondary | ICD-10-CM

## 2022-07-16 DIAGNOSIS — D352 Benign neoplasm of pituitary gland: Secondary | ICD-10-CM

## 2022-07-16 DIAGNOSIS — E785 Hyperlipidemia, unspecified: Secondary | ICD-10-CM | POA: Diagnosis not present

## 2022-07-16 DIAGNOSIS — E291 Testicular hypofunction: Secondary | ICD-10-CM

## 2022-07-16 DIAGNOSIS — S46912A Strain of unspecified muscle, fascia and tendon at shoulder and upper arm level, left arm, initial encounter: Secondary | ICD-10-CM

## 2022-07-16 MED ORDER — PREDNISONE 10 MG PO TABS: ORAL_TABLET | ORAL | 0 refills | Status: DC

## 2022-07-16 NOTE — Progress Notes (Signed)
[facility] [address] [place], [place] [address]  Internal MEDICINE  Office Visit Note  Patient Name: Stephen Lowery  [id number]  [id number]  Date of Service: 07/16/2022  Chief Complaint  Patient presents with   Follow-up   Hypertension    HPI Pt is here for routine follow up -working to cut out smoking, at 1ppd now and is using nicotine gum to help as well. Goal of 1/2 ppd for now -Left upper back into neck has been tight for the last 3 weeks, had tried heat and had a few days of muscle relaxer left over and tried this and NSAIDs and didn't improve. Not aware of any injury. Has had this before and will often go away after a week or so but this has lasted much longer -BP has been good at home when checked, though not checked recently since feeling well, only takes amlodipine and losartan, has clonidine only as needed and hasn't taken it in a long time -Due for labs before CPE  Current Medication: Outpatient Encounter Medications as of 07/16/2022  Medication Sig   amLODipine (NORVASC) 10 MG tablet Take 1 tablet (10 mg total) by mouth daily.   B-D INTEGRA SYRINGE 22G X 1-1/2" 3 ML MISC To use for testosterone injections.   buPROPion (WELLBUTRIN XL) 150 MG 24 hr tablet Take 1 tablet (150 mg total) by mouth daily.   cloNIDine (CATAPRES) 0.1 MG tablet Take 1 tablet (0.1 mg total) by mouth 2 (two) times daily as needed.   losartan (COZAAR) 100 MG tablet Take 1 tablet (100 mg total) by mouth daily.   minocycline (MINOCIN) 100 MG capsule TAKE ONE CAPSULE BY MOUTH ONE TIME DAILY FOR ACNE   predniSONE (DELTASONE) 10 MG tablet Use per dose pack   rosuvastatin (CRESTOR) 10 MG tablet Take 1 tablet (10 mg total) by mouth daily.   testosterone cypionate (DEPOTESTOSTERONE CYPIONATE) 200 MG/ML injection INJECT 0.5ML INTO THE MUSCLE EVERY 7 DAYS   No facility-administered encounter medications on file as of 07/16/2022.    Surgical History: Past Surgical History:  Procedure  Laterality Date   right hip replacement Right 06/17/2014    Medical History: Past Medical History:  Diagnosis Date   Anxiety    Hyperlipidemia    Hypertension     Family History: Family History  Problem Relation Age of Onset   Hypertension Mother    Hyperlipidemia Father    Hypertension Father     Social History   Socioeconomic History   Marital status: Married    Spouse name: Not on file   Number of children: Not on file   Years of education: Not on file   Highest education level: Not on file  Occupational History   Not on file  Tobacco Use   Smoking status: Every Day    Packs/day: 1    Types: Cigarettes    Start date: 09/03/2017   Smokeless tobacco: Current    Types: Snuff   Tobacco comments:    1 pack daily  Substance and Sexual Activity   Alcohol use: Yes    Comment: social   Drug use: No   Sexual activity: Not on file  Other Topics Concern   Not on file  Social History Narrative   Not on file   Social Determinants of Health   Financial Resource Strain: Not on file  Food Insecurity: Not on file  Transportation Needs: Not on file  Physical Activity: Not on file  Stress: Not on file  Social Connections: Not on file  Intimate Partner Violence: Not on file      Review of Systems  Constitutional:  Negative for chills, fatigue and unexpected weight change.  HENT:  Negative for congestion, postnasal drip, rhinorrhea, sneezing and sore throat.   Eyes:  Negative for redness.  Respiratory:  Negative for cough, chest tightness and shortness of breath.   Cardiovascular:  Negative for chest pain and palpitations.  Gastrointestinal:  Negative for abdominal pain, constipation, diarrhea, nausea and vomiting.  Genitourinary:  Negative for dysuria and frequency.  Musculoskeletal:  Positive for myalgias and neck pain. Negative for arthralgias, back pain and joint swelling.  Skin:  Negative for rash.  Neurological: Negative.  Negative for tremors and numbness.   Hematological:  Negative for adenopathy. Does not bruise/bleed easily.  Psychiatric/Behavioral:  Negative for behavioral problems (Depression), sleep disturbance and suicidal ideas. The patient is not nervous/anxious.     Vital Signs: BP (!) 142/80   Pulse 95   Temp 97.8 F (36.6 C)   Resp 16   Ht  (1.88 m)   Wt 221 lb (100.2 kg)   SpO2 98%   BMI 28.37 kg/m    Physical Exam Vitals and nursing note reviewed.  Constitutional:      General: He is not in acute distress.    Appearance: He is well-developed. He is not diaphoretic.  HENT:     Head: Normocephalic and atraumatic.     Mouth/Throat:     Pharynx: No oropharyngeal exudate.  Eyes:     Pupils: Pupils are equal, round, and reactive to light.  Neck:     Thyroid: No thyromegaly.     Vascular: No JVD.     Trachea: No tracheal deviation.  Cardiovascular:     Rate and Rhythm: Normal rate and regular rhythm.     Heart sounds: Normal heart sounds. No murmur heard.    No friction rub. No gallop.  Pulmonary:     Effort: Pulmonary effort is normal. No respiratory distress.     Breath sounds: No wheezing or rales.  Chest:     Chest wall: No tenderness.  Abdominal:     General: Bowel sounds are normal.     Palpations: Abdomen is soft.  Musculoskeletal:        General: Tenderness present. Normal range of motion.     Cervical back: Neck supple.     Comments: Tender and tight along left scapula and up into left side of neck  Lymphadenopathy:     Cervical: No cervical adenopathy.  Skin:    General: Skin is warm and dry.  Neurological:     Mental Status: He is alert and oriented to person, place, and time.     Cranial Nerves: No cranial nerve deficit.  Psychiatric:        Behavior: Behavior normal.        Thought Content: Thought content normal.        Judgment: Judgment normal.        Assessment/Plan: 1. Muscle strain of scapular region, left, initial encounter Will start on prednisone taper and contiue to  use heat and ibuprofen as needed. May try topical such as icy hot - predniSONE (DELTASONE) 10 MG tablet; Use per dose pack  Dispense: 21 tablet; Refill: 0  2. Essential hypertension Borderline in office, but just took meds and is in some pain. Will monitor  3. Hyperlipidemia, unspecified hyperlipidemia type - Lipid Panel With LDL/HDL Ratio  4. Cigarette smoker Continue  to work on smoking cessation  5. Screening for prostate cancer - PSA Total (Reflex To Free)  6. Testicular hypofunction - Testosterone,Free and Total  7. Pituitary microadenoma - TSH + free T4 - Testosterone,Free and Total  8. Other fatigue - TSH + free T4 - Comprehensive metabolic panel - CBC w/Diff/Platelet - Lipid Panel With LDL/HDL Ratio - Testosterone,Free and Total - PSA Total (Reflex To Free)   General Counseling: Dewitte verbalizes understanding of the findings of todays visit and agrees with plan of treatment. I have discussed any further diagnostic evaluation that may be needed or ordered today. We also reviewed his medications today. he has been encouraged to call the office with any questions or concerns that should arise related to todays visit.    Orders Placed This Encounter  Procedures   TSH + free T4   Comprehensive metabolic panel   CBC w/Diff/Platelet   Lipid Panel With LDL/HDL Ratio   Testosterone,Free and Total   PSA Total (Reflex To Free)    Meds ordered this encounter  Medications   predniSONE (DELTASONE) 10 MG tablet    Sig: Use per dose pack    Dispense:  21 tablet    Refill:  0    This patient was seen by Lynn Ito, PA-C in collaboration with Dr. Beverely Risen as a part of collaborative care agreement.   Total time spent:30 Minutes Time spent includes review of chart, medications, test results, and follow up plan with the patient.      Dr Lyndon Code Internal medicine

## 2022-07-24 ENCOUNTER — Encounter: Payer: Self-pay | Admitting: Physician Assistant

## 2022-07-25 ENCOUNTER — Other Ambulatory Visit: Payer: Self-pay | Admitting: Physician Assistant

## 2022-07-25 DIAGNOSIS — S46912A Strain of unspecified muscle, fascia and tendon at shoulder and upper arm level, left arm, initial encounter: Secondary | ICD-10-CM

## 2022-07-25 MED ORDER — CYCLOBENZAPRINE HCL 10 MG PO TABS: 10.0000 mg | ORAL_TABLET | Freq: Every day | ORAL | 0 refills | Status: DC

## 2022-07-25 MED ORDER — MELOXICAM 15 MG PO TABS: 15.0000 mg | ORAL_TABLET | Freq: Every day | ORAL | 0 refills | Status: DC

## 2022-08-30 ENCOUNTER — Encounter: Payer: BLUE CROSS/BLUE SHIELD | Admitting: Physician Assistant

## 2022-09-03 ENCOUNTER — Encounter: Payer: Self-pay | Admitting: Physician Assistant

## 2022-09-03 ENCOUNTER — Ambulatory Visit (INDEPENDENT_AMBULATORY_CARE_PROVIDER_SITE_OTHER): Payer: BLUE CROSS/BLUE SHIELD | Admitting: Physician Assistant

## 2022-09-03 VITALS — BP 130/70 | HR 95 | Temp 98.4°F | Resp 16 | Ht 74.0 in | Wt 214.2 lb

## 2022-09-03 DIAGNOSIS — Z0001 Encounter for general adult medical examination with abnormal findings: Secondary | ICD-10-CM

## 2022-09-03 DIAGNOSIS — F17210 Nicotine dependence, cigarettes, uncomplicated: Secondary | ICD-10-CM | POA: Diagnosis not present

## 2022-09-03 DIAGNOSIS — M47812 Spondylosis without myelopathy or radiculopathy, cervical region: Secondary | ICD-10-CM

## 2022-09-03 DIAGNOSIS — Z1212 Encounter for screening for malignant neoplasm of rectum: Secondary | ICD-10-CM

## 2022-09-03 DIAGNOSIS — I10 Essential (primary) hypertension: Secondary | ICD-10-CM | POA: Diagnosis not present

## 2022-09-03 DIAGNOSIS — R300 Dysuria: Secondary | ICD-10-CM

## 2022-09-03 DIAGNOSIS — Z1211 Encounter for screening for malignant neoplasm of colon: Secondary | ICD-10-CM

## 2022-09-03 DIAGNOSIS — E785 Hyperlipidemia, unspecified: Secondary | ICD-10-CM

## 2022-09-03 MED ORDER — ROSUVASTATIN CALCIUM 10 MG PO TABS: 10.0000 mg | ORAL_TABLET | Freq: Every day | ORAL | 1 refills | Status: DC

## 2022-09-03 MED ORDER — LOSARTAN POTASSIUM 100 MG PO TABS: 100.0000 mg | ORAL_TABLET | Freq: Every day | ORAL | 1 refills | Status: DC

## 2022-09-03 MED ORDER — BUPROPION HCL ER (XL) 150 MG PO TB24: 150.0000 mg | ORAL_TABLET | Freq: Every day | ORAL | 1 refills | Status: DC

## 2022-09-03 MED ORDER — MINOCYCLINE HCL 100 MG PO CAPS: ORAL_CAPSULE | ORAL | 3 refills | Status: DC

## 2022-09-03 MED ORDER — AMLODIPINE BESYLATE 10 MG PO TABS: 10.0000 mg | ORAL_TABLET | Freq: Every day | ORAL | 1 refills | Status: DC

## 2022-09-03 NOTE — Progress Notes (Signed)
[facility] [address] [place], [place] [address]  Internal MEDICINE  Office Visit Note  Patient Name: Stephen Lowery  [id number]  [id number]  Date of Service: 09/03/2022  Chief Complaint  Patient presents with   Annual Exam   Hyperlipidemia   Hypertension     HPI Pt is here for routine health maintenance examination and is doing well -Left shoulder/neck continues to bother him and has some numbness in arm, did see ortho and has xrays done and was recommended to do PT and if not improving then MRI. He still needs to schedule PT -will go get blood work -No Fhx of colon CA and prefers cologuard -Last testosterone misfired, has 1 more injection -1ppd, still working to quit -BP stable  Current Medication: Outpatient Encounter Medications as of 09/03/2022  Medication Sig   B-D INTEGRA SYRINGE 22G X 1-1/2" 3 ML MISC To use for testosterone injections.   cloNIDine (CATAPRES) 0.1 MG tablet Take 1 tablet (0.1 mg total) by mouth 2 (two) times daily as needed.   cyclobenzaprine (FLEXERIL) 10 MG tablet Take 1 tablet (10 mg total) by mouth at bedtime. Take one tab po qhs for back spasm prn only   meloxicam (MOBIC) 15 MG tablet Take 1 tablet (15 mg total) by mouth daily.   predniSONE (DELTASONE) 10 MG tablet Use per dose pack   testosterone cypionate (DEPOTESTOSTERONE CYPIONATE) 200 MG/ML injection INJECT 0.5ML INTO THE MUSCLE EVERY 7 DAYS   [DISCONTINUED] amLODipine (NORVASC) 10 MG tablet Take 1 tablet (10 mg total) by mouth daily.   [DISCONTINUED] buPROPion (WELLBUTRIN XL) 150 MG 24 hr tablet Take 1 tablet (150 mg total) by mouth daily.   [DISCONTINUED] losartan (COZAAR) 100 MG tablet Take 1 tablet (100 mg total) by mouth daily.   [DISCONTINUED] minocycline (MINOCIN) 100 MG capsule TAKE ONE CAPSULE BY MOUTH ONE TIME DAILY FOR ACNE   [DISCONTINUED] rosuvastatin (CRESTOR) 10 MG tablet Take 1 tablet (10 mg total) by mouth daily.   amLODipine (NORVASC) 10 MG tablet Take 1  tablet (10 mg total) by mouth daily.   buPROPion (WELLBUTRIN XL) 150 MG 24 hr tablet Take 1 tablet (150 mg total) by mouth daily.   losartan (COZAAR) 100 MG tablet Take 1 tablet (100 mg total) by mouth daily.   minocycline (MINOCIN) 100 MG capsule TAKE ONE CAPSULE BY MOUTH ONE TIME DAILY FOR ACNE   rosuvastatin (CRESTOR) 10 MG tablet Take 1 tablet (10 mg total) by mouth daily.   No facility-administered encounter medications on file as of 09/03/2022.    Surgical History: Past Surgical History:  Procedure Laterality Date   right hip replacement Right 06/17/2014    Medical History: Past Medical History:  Diagnosis Date   Anxiety    Hyperlipidemia    Hypertension     Family History: Family History  Problem Relation Age of Onset   Hypertension Mother    Hyperlipidemia Father    Hypertension Father       Review of Systems  Constitutional:  Negative for chills, fatigue and unexpected weight change.  HENT:  Negative for congestion, postnasal drip, rhinorrhea, sneezing and sore throat.   Eyes:  Negative for redness.  Respiratory:  Negative for cough, chest tightness and shortness of breath.   Cardiovascular:  Negative for chest pain and palpitations.  Gastrointestinal:  Negative for abdominal pain, constipation, diarrhea, nausea and vomiting.  Genitourinary:  Negative for dysuria and frequency.  Musculoskeletal:  Positive for myalgias and neck pain. Negative for arthralgias, back pain  and joint swelling.  Skin:  Negative for rash.  Neurological:  Positive for numbness. Negative for tremors.  Hematological:  Negative for adenopathy. Does not bruise/bleed easily.  Psychiatric/Behavioral:  Negative for behavioral problems (Depression), sleep disturbance and suicidal ideas. The patient is not nervous/anxious.      Vital Signs: BP 130/70   Pulse 95   Temp 98.4 F (36.9 C)   Resp 16   Ht 6\' 2"  (1.88 m)   Wt 214 lb 3.2 oz (97.2 kg)   SpO2 99%   BMI 27.50 kg/m    Physical  Exam Vitals and nursing note reviewed.  Constitutional:      General: He is not in acute distress.    Appearance: He is well-developed. He is not diaphoretic.  HENT:     Head: Normocephalic and atraumatic.     Mouth/Throat:     Pharynx: No oropharyngeal exudate.  Eyes:     Pupils: Pupils are equal, round, and reactive to light.  Neck:     Thyroid: No thyromegaly.     Vascular: No JVD.     Trachea: No tracheal deviation.  Cardiovascular:     Rate and Rhythm: Normal rate and regular rhythm.     Heart sounds: Normal heart sounds. No murmur heard.    No friction rub. No gallop.  Pulmonary:     Effort: Pulmonary effort is normal. No respiratory distress.     Breath sounds: No wheezing or rales.  Chest:     Chest wall: No tenderness.  Abdominal:     General: Bowel sounds are normal.     Palpations: Abdomen is soft.  Musculoskeletal:        General: Normal range of motion.     Cervical back: Normal range of motion and neck supple.  Lymphadenopathy:     Cervical: No cervical adenopathy.  Skin:    General: Skin is warm and dry.  Neurological:     Mental Status: He is alert and oriented to person, place, and time.     Cranial Nerves: No cranial nerve deficit.  Psychiatric:        Behavior: Behavior normal.        Thought Content: Thought content normal.        Judgment: Judgment normal.      LABS: No results found for this or any previous visit (from the past 2160 hour(s)).      Assessment/Plan: 1. Encounter for general adult medical examination with abnormal findings CPE performed, will have labs done and due for colon screening  2. Essential hypertension Stable, continue current medications - amLODipine (NORVASC) 10 MG tablet; Take 1 tablet (10 mg total) by mouth daily.  Dispense: 90 tablet; Refill: 1 - losartan (COZAAR) 100 MG tablet; Take 1 tablet (100 mg total) by mouth daily.  Dispense: 90 tablet; Refill: 1  3. Cervical spondylosis Followed by ortho and will  start PT  4. Screening for colorectal cancer - Cologuard  5. Cigarette smoker - buPROPion (WELLBUTRIN XL) 150 MG 24 hr tablet; Take 1 tablet (150 mg total) by mouth daily.  Dispense: 90 tablet; Refill: 1  6. Hyperlipidemia, unspecified hyperlipidemia type - rosuvastatin (CRESTOR) 10 MG tablet; Take 1 tablet (10 mg total) by mouth daily.  Dispense: 90 tablet; Refill: 1  7. Dysuria - UA/M w/rflx Culture, Routine   General Counseling: Curtez verbalizes understanding of the findings of todays visit and agrees with plan of treatment. I have discussed any further diagnostic evaluation that may be needed  or ordered today. We also reviewed his medications today. he has been encouraged to call the office with any questions or concerns that should arise related to todays visit.    Counseling:    Orders Placed This Encounter  Procedures   UA/M w/rflx Culture, Routine   Cologuard    Meds ordered this encounter  Medications   amLODipine (NORVASC) 10 MG tablet    Sig: Take 1 tablet (10 mg total) by mouth daily.    Dispense:  90 tablet    Refill:  1   buPROPion (WELLBUTRIN XL) 150 MG 24 hr tablet    Sig: Take 1 tablet (150 mg total) by mouth daily.    Dispense:  90 tablet    Refill:  1   losartan (COZAAR) 100 MG tablet    Sig: Take 1 tablet (100 mg total) by mouth daily.    Dispense:  90 tablet    Refill:  1   minocycline (MINOCIN) 100 MG capsule    Sig: TAKE ONE CAPSULE BY MOUTH ONE TIME DAILY FOR ACNE    Dispense:  30 capsule    Refill:  3   rosuvastatin (CRESTOR) 10 MG tablet    Sig: Take 1 tablet (10 mg total) by mouth daily.    Dispense:  90 tablet    Refill:  1    This patient was seen by Lynn Ito, PA-C in collaboration with Dr. Beverely Risen as a part of collaborative care agreement.  Total time spent:35 Minutes  Time spent includes review of chart, medications, test results, and follow up plan with the patient.     Lyndon Code, MD  Internal Medicine

## 2022-09-04 LAB — UA/M W/RFLX CULTURE, ROUTINE
Glucose, UA: NEGATIVE
Leukocytes,UA: NEGATIVE
Nitrite, UA: NEGATIVE
RBC, UA: NEGATIVE
Specific Gravity, UA: 1.025 (ref 1.005–1.030)
Urobilinogen, Ur: 1 mg/dL (ref 0.2–1.0)
pH, UA: 5.5 (ref 5.0–7.5)

## 2022-09-04 LAB — MICROSCOPIC EXAMINATION
Bacteria, UA: NONE SEEN
Casts: NONE SEEN /lpf
Epithelial Cells (non renal): NONE SEEN /hpf (ref 0–10)
RBC, Urine: NONE SEEN /hpf (ref 0–2)
WBC, UA: NONE SEEN /hpf (ref 0–5)

## 2022-09-07 LAB — COMPREHENSIVE METABOLIC PANEL
ALT: 20 IU/L (ref 0–44)
AST: 18 IU/L (ref 0–40)
Albumin/Globulin Ratio: 2.6
Albumin: 4.7 g/dL (ref 4.1–5.1)
Alkaline Phosphatase: 85 IU/L (ref 44–121)
BUN/Creatinine Ratio: 12 (ref 9–20)
BUN: 11 mg/dL (ref 6–24)
Bilirubin Total: 0.6 mg/dL (ref 0.0–1.2)
CO2: 23 mmol/L (ref 20–29)
Calcium: 9.6 mg/dL (ref 8.7–10.2)
Chloride: 105 mmol/L (ref 96–106)
Creatinine, Ser: 0.93 mg/dL (ref 0.76–1.27)
Globulin, Total: 1.8 g/dL (ref 1.5–4.5)
Glucose: 80 mg/dL (ref 70–99)
Potassium: 4.2 mmol/L (ref 3.5–5.2)
Sodium: 141 mmol/L (ref 134–144)
Total Protein: 6.5 g/dL (ref 6.0–8.5)
eGFR: 103 mL/min/{1.73_m2} (ref >59)

## 2022-09-07 LAB — CBC WITH DIFFERENTIAL/PLATELET
Basophils Absolute: 0.1 10*3/uL (ref 0.0–0.2)
Basos: 1 %
EOS (ABSOLUTE): 0.8 10*3/uL — ABNORMAL HIGH (ref 0.0–0.4)
Eos: 13 %
Hematocrit: 48.4 % (ref 37.5–51.0)
Hemoglobin: 16.5 g/dL (ref 13.0–17.7)
Immature Grans (Abs): 0 10*3/uL (ref 0.0–0.1)
Immature Granulocytes: 0 %
Lymphocytes Absolute: 2.1 10*3/uL (ref 0.7–3.1)
Lymphs: 35 %
MCH: 32.9 pg (ref 26.6–33.0)
MCHC: 34.1 g/dL (ref 31.5–35.7)
MCV: 96 fL (ref 79–97)
Monocytes Absolute: 0.5 10*3/uL (ref 0.1–0.9)
Monocytes: 8 %
Neutrophils Absolute: 2.6 10*3/uL (ref 1.4–7.0)
Neutrophils: 43 %
Platelets: 238 10*3/uL (ref 150–450)
RBC: 5.02 x10E6/uL (ref 4.14–5.80)
RDW: 13 % (ref 11.6–15.4)
WBC: 5.9 10*3/uL (ref 3.4–10.8)

## 2022-09-07 LAB — LIPID PANEL WITH LDL/HDL RATIO
Cholesterol, Total: 174 mg/dL (ref 100–199)
HDL: 60 mg/dL (ref >39)
LDL Chol Calc (NIH): 92 mg/dL (ref 0–99)
LDL/HDL Ratio: 1.5 ratio (ref 0.0–3.6)
Triglycerides: 125 mg/dL (ref 0–149)
VLDL Cholesterol Cal: 22 mg/dL (ref 5–40)

## 2022-09-07 LAB — PSA TOTAL (REFLEX TO FREE): Prostate Specific Ag, Serum: 2 ng/mL (ref 0.0–4.0)

## 2022-09-07 LAB — TSH+FREE T4
Free T4: 1.13 ng/dL (ref 0.82–1.77)
TSH: 1.45 u[IU]/mL (ref 0.450–4.500)

## 2022-09-07 LAB — TESTOSTERONE,FREE AND TOTAL
Testosterone, Free: 18.5 pg/mL (ref 6.8–21.5)
Testosterone: 696 ng/dL (ref 264–916)

## 2022-09-11 ENCOUNTER — Other Ambulatory Visit: Payer: Self-pay | Admitting: Physician Assistant

## 2022-09-11 DIAGNOSIS — E291 Testicular hypofunction: Secondary | ICD-10-CM

## 2022-09-11 MED ORDER — TESTOSTERONE CYPIONATE 200 MG/ML IM SOLN
INTRAMUSCULAR | 1 refills | Status: DC
Start: 2022-09-11 — End: 2022-12-28

## 2022-09-19 ENCOUNTER — Telehealth: Payer: BLUE CROSS/BLUE SHIELD | Admitting: Physician Assistant

## 2022-09-19 DIAGNOSIS — J9801 Acute bronchospasm: Secondary | ICD-10-CM | POA: Diagnosis not present

## 2022-09-19 MED ORDER — ALBUTEROL SULFATE HFA 108 (90 BASE) MCG/ACT IN AERS
1.0000 | INHALATION_SPRAY | Freq: Four times a day (QID) | RESPIRATORY_TRACT | 0 refills | Status: AC | PRN
Start: 2022-09-19 — End: ?

## 2022-09-19 NOTE — Patient Instructions (Signed)
Stephen Lowery, thank you for joining Stephen Loveless, PA-C for today's virtual visit.  While this provider is not your primary care provider (PCP), if your PCP is located in our provider database this encounter information will be shared with them immediately following your visit.   A [facility] MyChart account gives you access to today's visit and all your visits, tests, and labs performed at [facility] " click here if you don't have a [facility] MyChart account or go to [email or web address]  Consent: (Patient) Stephen Lowery provided verbal consent for this virtual visit at the beginning of the encounter.  Current Medications:  Current Outpatient Medications:    albuterol (VENTOLIN HFA) 108 (90 Base) MCG/ACT inhaler, Inhale 1-2 puffs into the lungs every 6 (six) hours as needed., Disp: 8 g, Rfl: 0   amLODipine (NORVASC) 10 MG tablet, Take 1 tablet (10 mg total) by mouth daily., Disp: 90 tablet, Rfl: 1   B-D INTEGRA SYRINGE 22G X 1-1/2" 3 ML MISC, To use for testosterone injections., Disp: 5 each, Rfl: 5   buPROPion (WELLBUTRIN XL) 150 MG 24 hr tablet, Take 1 tablet (150 mg total) by mouth daily., Disp: 90 tablet, Rfl: 1   cloNIDine (CATAPRES) 0.1 MG tablet, Take 1 tablet (0.1 mg total) by mouth 2 (two) times daily as needed., Disp: 10 tablet, Rfl: 0   cyclobenzaprine (FLEXERIL) 10 MG tablet, Take 1 tablet (10 mg total) by mouth at bedtime. Take one tab po qhs for back spasm prn only, Disp: 20 tablet, Rfl: 0   losartan (COZAAR) 100 MG tablet, Take 1 tablet (100 mg total) by mouth daily., Disp: 90 tablet, Rfl: 1   meloxicam (MOBIC) 15 MG tablet, Take 1 tablet (15 mg total) by mouth daily., Disp: 20 tablet, Rfl: 0   minocycline (MINOCIN) 100 MG capsule, TAKE ONE CAPSULE BY MOUTH ONE TIME DAILY FOR ACNE, Disp: 30 capsule, Rfl: 3   predniSONE (DELTASONE) 10 MG tablet, Use per dose pack, Disp: 21 tablet, Rfl: 0   rosuvastatin (CRESTOR) 10 MG tablet, Take 1 tablet (10 mg  total) by mouth daily., Disp: 90 tablet, Rfl: 1   testosterone cypionate (DEPOTESTOSTERONE CYPIONATE) 200 MG/ML injection, INJECT 0.5ML INTO THE MUSCLE EVERY 7 DAYS, Disp: 4 mL, Rfl: 1   Medications ordered in this encounter:  Meds ordered this encounter  Medications   albuterol (VENTOLIN HFA) 108 (90 Base) MCG/ACT inhaler    Sig: Inhale 1-2 puffs into the lungs every 6 (six) hours as needed.    Dispense:  8 g    Refill:  0    Order Specific Question:   Supervising Provider    Answer:   Merrilee Jansky [id number]     *If you need refills on other medications prior to your next appointment, please contact your pharmacy*  Follow-Up: Call back or seek an in-person evaluation if the symptoms worsen or if the condition fails to improve as anticipated.  [facility] [phone]  Other Instructions  Bronchospasm, Adult  Bronchospasm is when the small airways in the lungs narrow. This can make it very hard to breathe. Swelling and more mucus than normal can add to this problem. What are the causes? Having a cold. Exercise. The smell from sprays, perfumes, candles, and cleaners. Cold air. Stress or strong feelings such as laughing or crying. What increases the risk? Having asthma. Smoking. Being around someone who smokes (secondhand smoke). Having allergies. Being allergic to certain foods, medicine, or bug bites or  stings. What are the signs or symptoms? Making a high-pitched whistling sound when you breathe, most often when you breathe out (wheezing). Coughing. A tight feeling in your chest. Feeling like you cannot catch your breath. Feeling like you have no energy to exercise. Breathing that is noisy. A cough that has a high pitch. How is this treated?  Using medicines that you breathe in (inhale). These open up the airways and help you breathe. Medicines can be taken with a metered dose inhaler or a nebulizer device. Taking medicines to reduce  swelling. Getting rid of what started the bronchospasm. Follow these instructions at home: Medicines Take over-the-counter and prescription medicines only as told by your doctor. If you need to use an inhaler or nebulizer to take your medicine, ask your doctor how to use it. You may be given a spacer to use with your inhaler. This makes it easier to get the medicine from the inhaler into your lungs. Lifestyle Do not smoke or use any products that contain nicotine or tobacco. If you need help quitting, ask your doctor. Keep track of things that start your bronchospasm. Avoid these if you can. When pollen, air pollution, or humidity is bad, keep windows closed. Use an air conditioner if you have one. Find ways to cope with stress and your feelings. Activity Some people have bronchospasm when they exercise. This is called exercise-induced bronchoconstriction (EIB). If you have this problem, talk with your doctor about how to deal with EIB. Some tips include: Use your inhaler before exercise. Exercise indoors if it is very cold or humid, or if the pollen and mold counts are high. Warm up and cool down before and after exercise. Stop your exercise right away if your symptoms start or get worse. General instructions If you have asthma, make sure you have an asthma action plan. Stay up to date on your shots (immunizations). Keep all follow-up visits. Get help right away if: You have trouble breathing. You wheeze and cough and this does not get better after you take medicine. You have chest pain. You have trouble speaking more than one word in a sentence. These symptoms may be an emergency. Get help right away. Call 911. Do not wait to see if the symptoms will go away. Do not drive yourself to the hospital. Summary Bronchospasm is when the small airways in the lungs narrow. Swelling and more mucus than normal can add to this problem. This can make it very hard to breathe. Do not smoke or use  any products that contain nicotine or tobacco. If you need help quitting, ask your doctor. Get help right away if you wheeze and cough and this does not get better after you take medicine. This information is not intended to replace advice given to you by your health care provider. Make sure you discuss any questions you have with your health care provider. Document Revised: 10/03/2020 Document Reviewed: 10/03/2020 Elsevier Patient Education  2024 [employer]vier Inc.    If you have been instructed to have an in-person evaluation today at a local Urgent Care facility, please use the link below. It will take you to a list of all of our available [facility], including address, phone number and hours of operation. Please do not delay care.  [facility]  If you or a family member do not have a primary care provider, use the link below to schedule a visit and establish care. When you choose a [facility] primary care  physician or advanced practice provider, you gain a long-term partner in health. Find a Primary Care Provider  Learn more about [facility] in-office and virtual care options: [facility]

## 2022-09-19 NOTE — Progress Notes (Signed)
Virtual Visit Consent   Stephen Lowery, you are scheduled for a virtual visit with a [facility] provider today. Just as with appointments in the office, your consent must be obtained to participate. Your consent will be active for this visit and any virtual visit you may have with one of our providers in the next 365 days. If you have a MyChart account, a copy of this consent can be sent to you electronically.  As this is a virtual visit, video technology does not allow for your provider to perform a traditional examination. This may limit your provider's ability to fully assess your condition. If your provider identifies any concerns that need to be evaluated in person or the need to arrange testing (such as labs, EKG, etc.), we will make arrangements to do so. Although advances in technology are sophisticated, we cannot ensure that it will always work on either your end or our end. If the connection with a video visit is poor, the visit may have to be switched to a telephone visit. With either a video or telephone visit, we are not always able to ensure that we have a secure connection.  By engaging in this virtual visit, you consent to the provision of healthcare and authorize for your insurance to be billed (if applicable) for the services provided during this visit. Depending on your insurance coverage, you may receive a charge related to this service.  I need to obtain your verbal consent now. Are you willing to proceed with your visit today? Stephen Lowery has provided verbal consent on 09/19/2022 for a virtual visit (video or telephone). Stephen Loveless, PA-C  Date: 09/19/2022 2:03 PM  Virtual Visit via Video Note   I, Stephen Lowery, connected with  Stephen Lowery  ([id number], 1975/04/20) on 09/19/22 at  2:00 PM EDT by a video-enabled telemedicine application and verified that I am speaking with the correct person using two identifiers.  Location: Patient: Virtual Visit Location  Patient: Home Provider: Virtual Visit Location Provider: Home Office   I discussed the limitations of evaluation and management by telemedicine and the availability of in person appointments. The patient expressed understanding and agreed to proceed.    History of Present Illness: Stephen Lowery is a 47 y.o. who identifies as a male who was assigned male at birth, and is being seen today for occasional breathing episodes. Reports rarely he has these episodes where he develops acute shortness of breath and then subsequent difficulty breathing. Episodes can last a few minutes and increase anxiety during the episodes. Reports he may have had 5 episodes in his lifetime. Does feel coughing spells have triggered. Once was triggered in a hot tub (last year). Most recent episode occurred while sleeping last night and awoke him at 6am. He denies any childhood asthma. He is a smoker, but trying to quit.   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   Difficult or painful urination 06/17/2017   Family history of benign neoplasm of pituitary gland and  craniopharyngeal duct 06/17/2017   Hyperlipidemia 04/12/2017    Allergies: No Known Allergies Medications:  Current Outpatient Medications:    albuterol (VENTOLIN HFA) 108 (90 Base) MCG/ACT inhaler, Inhale 1-2 puffs into the lungs every 6 (six) hours as needed., Disp: 8 g, Rfl: 0   amLODipine (NORVASC) 10 MG tablet, Take 1 tablet (10  mg total) by mouth daily., Disp: 90 tablet, Rfl: 1   B-D INTEGRA SYRINGE 22G X 1-1/2" 3 ML MISC, To use for testosterone injections., Disp: 5 each, Rfl: 5   buPROPion (WELLBUTRIN XL) 150 MG 24 hr tablet, Take 1 tablet (150 mg total) by mouth daily., Disp: 90 tablet, Rfl: 1   cloNIDine (CATAPRES) 0.1 MG tablet, Take 1 tablet (0.1 mg total) by mouth 2 (two) times daily as needed., Disp: 10 tablet, Rfl: 0   cyclobenzaprine (FLEXERIL) 10 MG tablet, Take 1 tablet (10 mg total) by mouth at bedtime. Take one tab po qhs for back spasm prn only, Disp: 20 tablet, Rfl: 0   losartan (COZAAR) 100 MG tablet, Take 1 tablet (100 mg total) by mouth daily., Disp: 90 tablet, Rfl: 1   meloxicam (MOBIC) 15 MG tablet, Take 1 tablet (15 mg total) by mouth daily., Disp: 20 tablet, Rfl: 0   minocycline (MINOCIN) 100 MG capsule, TAKE ONE CAPSULE BY MOUTH ONE TIME DAILY FOR ACNE, Disp: 30 capsule, Rfl: 3   predniSONE (DELTASONE) 10 MG tablet, Use per dose pack, Disp: 21 tablet, Rfl: 0   rosuvastatin (CRESTOR) 10 MG tablet, Take 1 tablet (10 mg total) by mouth daily., Disp: 90 tablet, Rfl: 1   testosterone cypionate (DEPOTESTOSTERONE CYPIONATE) 200 MG/ML injection, INJECT 0.5ML INTO THE MUSCLE EVERY 7 DAYS, Disp: 4 mL, Rfl: 1  Observations/Objective: Patient is well-developed, well-nourished in no acute distress.  Resting comfortably at home.  Head is normocephalic, atraumatic.  No labored breathing.  Speech is clear and coherent with logical content.  Patient is alert and oriented at baseline.    Assessment and Plan: 1. Bronchospasm - albuterol (VENTOLIN HFA) 108 (90 Base) MCG/ACT inhaler; Inhale 1-2 puffs into the lungs every 6 (six) hours as needed.  Dispense: 8 g; Refill: 0  - Albuterol PRN - Discussed following up with PCP for further evaluation of GERD, sleep apnea, and PFT for further determination of causes (discussed potential of being multifactorial) - Follow up if worsening acutely  Follow Up  Instructions: I discussed the assessment and treatment plan with the patient. The patient was provided an opportunity to ask questions and all were answered. The patient agreed with the plan and demonstrated an understanding of the instructions.  A copy of instructions were sent to the patient via MyChart unless otherwise noted below.    The patient was advised to call back or seek an in-person evaluation if the symptoms worsen or if the condition fails to improve as anticipated.  Time:  I spent 10 minutes with the patient via telehealth technology discussing the above problems/concerns.    Stephen Loveless, PA-C

## 2022-09-21 LAB — COLOGUARD: COLOGUARD: NEGATIVE

## 2022-10-23 ENCOUNTER — Encounter: Payer: Self-pay | Admitting: Physician Assistant

## 2022-11-29 ENCOUNTER — Ambulatory Visit: Payer: BLUE CROSS/BLUE SHIELD | Admitting: Physician Assistant

## 2022-12-26 ENCOUNTER — Other Ambulatory Visit: Payer: Self-pay | Admitting: Physician Assistant

## 2022-12-26 DIAGNOSIS — I10 Essential (primary) hypertension: Secondary | ICD-10-CM

## 2022-12-26 DIAGNOSIS — E785 Hyperlipidemia, unspecified: Secondary | ICD-10-CM

## 2022-12-26 DIAGNOSIS — E291 Testicular hypofunction: Secondary | ICD-10-CM

## 2023-01-03 ENCOUNTER — Telehealth: Payer: Self-pay | Admitting: Physician Assistant

## 2023-01-03 ENCOUNTER — Encounter: Payer: Self-pay | Admitting: Physician Assistant

## 2023-01-03 ENCOUNTER — Telehealth (INDEPENDENT_AMBULATORY_CARE_PROVIDER_SITE_OTHER): Payer: BLUE CROSS/BLUE SHIELD | Admitting: Physician Assistant

## 2023-01-03 VITALS — Ht 74.0 in | Wt 214.0 lb

## 2023-01-03 DIAGNOSIS — U071 COVID-19: Secondary | ICD-10-CM | POA: Diagnosis not present

## 2023-01-03 DIAGNOSIS — I10 Essential (primary) hypertension: Secondary | ICD-10-CM

## 2023-01-03 DIAGNOSIS — E291 Testicular hypofunction: Secondary | ICD-10-CM | POA: Diagnosis not present

## 2023-01-03 DIAGNOSIS — E785 Hyperlipidemia, unspecified: Secondary | ICD-10-CM

## 2023-01-03 NOTE — Telephone Encounter (Signed)
Lvm and sent message to schedule today's 3 month follow up-[name]

## 2023-01-03 NOTE — Progress Notes (Signed)
[facility] [address] [place], [place] [address]  Internal MEDICINE  Telephone Visit  Patient Name: Stephen Lowery  [id number]  [id number]  Date of Service: 01/03/2023  I connected with the patient at 8:34 by telephone and verified the patients identity using two identifiers.   I discussed the limitations, risks, security and privacy concerns of performing an evaluation and management service by telephone and the availability of in person appointments. I also discussed with the patient that there may be a patient responsible charge related to the service.  The patient expressed understanding and agrees to proceed.    Chief Complaint  Patient presents with   Telephone Assessment    Covid  positive going on for 3 days    Telephone Screen     [phone]   Follow-up    Med     HPI Pt is here for virtual visit due to testing positive for covid -symptoms started 3-4 days ago -a little stuffy, tired, taking some tylenol and overall managing well. Does not feel he needs any other medication at this time -BP not checked recently -refills sent, due to recheck levels in 2 months and will go ahead and order -sleeping ok -staying hydrated  Current Medication: Outpatient Encounter Medications as of 01/03/2023  Medication Sig   albuterol (VENTOLIN HFA) 108 (90 Base) MCG/ACT inhaler Inhale 1-2 puffs into the lungs every 6 (six) hours as needed.   amLODipine (NORVASC) 10 MG tablet TAKE ONE TABLET BY MOUTH ONE TIME DAILY   B-D INTEGRA SYRINGE 22G X 1-1/2" 3 ML MISC To use for testosterone injections.   buPROPion (WELLBUTRIN XL) 150 MG 24 hr tablet Take 1 tablet (150 mg total) by mouth daily.   cloNIDine (CATAPRES) 0.1 MG tablet Take 1 tablet (0.1 mg total) by mouth 2 (two) times daily as needed.   cyclobenzaprine (FLEXERIL) 10 MG tablet Take 1 tablet (10 mg total) by mouth at bedtime. Take one tab po qhs for back spasm prn only   losartan (COZAAR) 100 MG tablet TAKE ONE TABLET  BY MOUTH ONE TIME DAILY   meloxicam (MOBIC) 15 MG tablet Take 1 tablet (15 mg total) by mouth daily.   minocycline (MINOCIN) 100 MG capsule TAKE ONE CAPSULE BY MOUTH ONE TIME DAILY FOR ACNE   predniSONE (DELTASONE) 10 MG tablet Use per dose pack   rosuvastatin (CRESTOR) 10 MG tablet TAKE ONE TABLET BY MOUTH ONE TIME DAILY   testosterone cypionate (DEPOTESTOSTERONE CYPIONATE) 200 MG/ML injection INJECT 0.5 ML INTRAMUSCULARLY ONCE WEEKLY   No facility-administered encounter medications on file as of 01/03/2023.    Surgical History: Past Surgical History:  Procedure Laterality Date   right hip replacement Right 06/17/2014    Medical History: Past Medical History:  Diagnosis Date   Anxiety    Hyperlipidemia    Hypertension     Family History: Family History  Problem Relation Age of Onset   Hypertension Mother    Hyperlipidemia Father    Hypertension Father     Social History   Socioeconomic History   Marital status: Married    Spouse name: Not on file   Number of children: Not on file   Years of education: Not on file   Highest education level: Not on file  Occupational History   Not on file  Tobacco Use   Smoking status: Every Day    Current packs/day: 1.00    Average packs/day: 1 pack/day for 5.3 years (5.3 ttl pk-yrs)    Types:  Cigarettes    Start date: 09/03/2017   Smokeless tobacco: Current    Types: Snuff   Tobacco comments:    1 pack daily  Substance and Sexual Activity   Alcohol use: Yes    Comment: social   Drug use: No   Sexual activity: Not on file  Other Topics Concern   Not on file  Social History Narrative   Not on file   Social Determinants of Health   Financial Resource Strain: Not on file  Food Insecurity: Not on file  Transportation Needs: Not on file  Physical Activity: Not on file  Stress: Not on file  Social Connections: Not on file  Intimate Partner Violence: Not on file      Review of Systems  Constitutional:  Positive for  fatigue. Negative for chills and unexpected weight change.  HENT:  Positive for congestion and postnasal drip. Negative for rhinorrhea and sneezing.   Eyes:  Negative for redness.  Respiratory:  Negative for chest tightness and shortness of breath.   Cardiovascular:  Negative for chest pain and palpitations.  Gastrointestinal:  Negative for abdominal pain, constipation, diarrhea, nausea and vomiting.  Genitourinary:  Negative for dysuria and frequency.  Musculoskeletal:  Positive for myalgias. Negative for arthralgias, back pain, joint swelling and neck pain.  Skin:  Negative for rash.  Neurological: Negative.  Negative for tremors and numbness.  Hematological:  Negative for adenopathy. Does not bruise/bleed easily.  Psychiatric/Behavioral:  Negative for behavioral problems (Depression), sleep disturbance and suicidal ideas. The patient is not nervous/anxious.     Vital Signs: Ht 6\' 2"  (1.88 m)   Wt 214 lb (97.1 kg)   BMI 27.48 kg/m    Observation/Objective:  Pt is able to carry out conversation   Assessment/Plan: 1. Testicular hypofunction Continue current medication and will check labs in 2-3 months - Testosterone,Free and Total  2. Essential hypertension Monitor and continue current medication  3. Acute COVID-19 Continue to rest and hydrate and use tylenol as needed  4. Hyperlipidemia, unspecified hyperlipidemia type Continue crestor and will check labs - Lipid Panel With LDL/HDL Ratio   General Counseling: Stephen Lowery verbalizes understanding of the findings of today's phone visit and agrees with plan of treatment. I have discussed any further diagnostic evaluation that may be needed or ordered today. We also reviewed his medications today. he has been encouraged to call the office with any questions or concerns that should arise related to todays visit.    Orders Placed This Encounter  Procedures   Testosterone,Free and Total   Lipid Panel With LDL/HDL Ratio    No  orders of the defined types were placed in this encounter.   Time spent:30 Minutes    Dr Lyndon Code Internal medicine

## 2023-02-20 ENCOUNTER — Telehealth: Payer: Self-pay | Admitting: Physician Assistant

## 2023-02-20 NOTE — Telephone Encounter (Signed)
DOS 07/16/22 office note faxed to [facility]; [phone]

## 2023-03-04 ENCOUNTER — Other Ambulatory Visit: Payer: Self-pay | Admitting: Physician Assistant

## 2023-04-08 ENCOUNTER — Ambulatory Visit: Payer: BLUE CROSS/BLUE SHIELD | Admitting: Physician Assistant

## 2023-04-10 ENCOUNTER — Other Ambulatory Visit: Payer: Self-pay | Admitting: Physician Assistant

## 2023-04-10 DIAGNOSIS — E291 Testicular hypofunction: Secondary | ICD-10-CM

## 2023-04-10 NOTE — Telephone Encounter (Signed)
 Please review and send

## 2023-04-16 ENCOUNTER — Other Ambulatory Visit: Payer: Self-pay | Admitting: Physician Assistant

## 2023-04-16 ENCOUNTER — Telehealth: Payer: Self-pay

## 2023-04-16 DIAGNOSIS — E291 Testicular hypofunction: Secondary | ICD-10-CM

## 2023-04-16 MED ORDER — TESTOSTERONE CYPIONATE 200 MG/ML IM SOLN: INTRAMUSCULAR | 0 refills | Status: DC

## 2023-04-16 NOTE — Telephone Encounter (Signed)
Left message for patient to give office a call.

## 2023-04-17 NOTE — Telephone Encounter (Signed)
Patient notified

## 2023-04-25 ENCOUNTER — Ambulatory Visit: Payer: BLUE CROSS/BLUE SHIELD | Admitting: Physician Assistant

## 2023-05-13 ENCOUNTER — Encounter: Payer: Self-pay | Admitting: Physician Assistant

## 2023-05-13 ENCOUNTER — Ambulatory Visit (INDEPENDENT_AMBULATORY_CARE_PROVIDER_SITE_OTHER): Payer: BLUE CROSS/BLUE SHIELD | Admitting: Physician Assistant

## 2023-05-13 VITALS — BP 135/80 | HR 70 | Temp 98.2°F | Resp 16 | Ht 74.0 in | Wt 222.8 lb

## 2023-05-13 DIAGNOSIS — E291 Testicular hypofunction: Secondary | ICD-10-CM | POA: Diagnosis not present

## 2023-05-13 DIAGNOSIS — F17210 Nicotine dependence, cigarettes, uncomplicated: Secondary | ICD-10-CM | POA: Diagnosis not present

## 2023-05-13 DIAGNOSIS — I10 Essential (primary) hypertension: Secondary | ICD-10-CM | POA: Diagnosis not present

## 2023-05-13 DIAGNOSIS — E785 Hyperlipidemia, unspecified: Secondary | ICD-10-CM | POA: Diagnosis not present

## 2023-05-13 MED ORDER — TESTOSTERONE CYPIONATE 100 MG/ML IM SOLN: 100.0000 mg | INTRAMUSCULAR | 0 refills | Status: DC

## 2023-05-13 MED ORDER — BUPROPION HCL ER (XL) 150 MG PO TB24: 150.0000 mg | ORAL_TABLET | Freq: Every day | ORAL | 1 refills | Status: DC

## 2023-05-13 NOTE — Progress Notes (Signed)
 [facility] [address] [place], [place] [address]  Internal MEDICINE  Office Visit Note  Patient Name: Stephen Lowery  [id number]  [id number]  Date of Service: 05/24/2023  Chief Complaint  Patient presents with   Follow-up   Hyperlipidemia   Hypertension   Medication Refill    HPI Pt is here for routine follow up -has not had labs done and will do so now -does need refills, would like to retry lower weekly testosterone dosing than every other week since he feels like it start to wear off in second week -Feeling well, no concerns today -BP stable -still working on cutting back on smoking, needs wellbutrin refill  Current Medication: Outpatient Encounter Medications as of 05/13/2023  Medication Sig   albuterol (VENTOLIN HFA) 108 (90 Base) MCG/ACT inhaler Inhale 1-2 puffs into the lungs every 6 (six) hours as needed.   amLODipine (NORVASC) 10 MG tablet TAKE ONE TABLET BY MOUTH ONE TIME DAILY   B-D INTEGRA SYRINGE 22G X 1-1/2" 3 ML MISC To use for testosterone injections.   cloNIDine (CATAPRES) 0.1 MG tablet Take 1 tablet (0.1 mg total) by mouth 2 (two) times daily as needed.   losartan (COZAAR) 100 MG tablet TAKE ONE TABLET BY MOUTH ONE TIME DAILY   minocycline (MINOCIN) 100 MG capsule TAKE ONE CAPSULE BY MOUTH ONE TIME DAILY FOR ACNE   rosuvastatin (CRESTOR) 10 MG tablet TAKE ONE TABLET BY MOUTH ONE TIME DAILY   testosterone cypionate (DEPOTESTOTERONE CYPIONATE) 100 MG/ML injection Inject 1 mL (100 mg total) into the muscle once a week. For IM use only   [DISCONTINUED] buPROPion (WELLBUTRIN XL) 150 MG 24 hr tablet Take 1 tablet (150 mg total) by mouth daily.   [DISCONTINUED] cyclobenzaprine (FLEXERIL) 10 MG tablet Take 1 tablet (10 mg total) by mouth at bedtime. Take one tab po qhs for back spasm prn only   [DISCONTINUED] meloxicam (MOBIC) 15 MG tablet Take 1 tablet (15 mg total) by mouth daily.   [DISCONTINUED] predniSONE (DELTASONE) 10 MG tablet Use per dose pack    [DISCONTINUED] testosterone cypionate (DEPOTESTOSTERONE CYPIONATE) 200 MG/ML injection INJECT 0.5 ML INTRAMUSCULARLY ONCE WEEKLY   buPROPion (WELLBUTRIN XL) 150 MG 24 hr tablet Take 1 tablet (150 mg total) by mouth daily.   No facility-administered encounter medications on file as of 05/13/2023.    Surgical History: Past Surgical History:  Procedure Laterality Date   right hip replacement Right 06/17/2014    Medical History: Past Medical History:  Diagnosis Date   Anxiety    Hyperlipidemia    Hypertension     Family History: Family History  Problem Relation Age of Onset   Hypertension Mother    Hyperlipidemia Father    Hypertension Father     Social History   Socioeconomic History   Marital status: Married    Spouse name: Not on file   Number of children: Not on file   Years of education: Not on file   Highest education level: Not on file  Occupational History   Not on file  Tobacco Use   Smoking status: Every Day    Current packs/day: 1.00    Average packs/day: 1 pack/day for 5.7 years (5.7 ttl pk-yrs)    Types: Cigarettes    Start date: 09/03/2017   Smokeless tobacco: Current    Types: Snuff   Tobacco comments:    1 pack daily  Substance and Sexual Activity   Alcohol use: Yes    Comment: social   Drug  use: No   Sexual activity: Not on file  Other Topics Concern   Not on file  Social History Narrative   Not on file   Social Drivers of Health   Financial Resource Strain: Not on file  Food Insecurity: Not on file  Transportation Needs: Not on file  Physical Activity: Not on file  Stress: Not on file  Social Connections: Not on file  Intimate Partner Violence: Not on file      Review of Systems  Constitutional:  Negative for chills, fatigue and unexpected weight change.  HENT:  Negative for congestion, postnasal drip, rhinorrhea, sneezing and sore throat.   Eyes:  Negative for redness.  Respiratory:  Negative for cough, chest tightness and  shortness of breath.   Cardiovascular:  Negative for chest pain and palpitations.  Gastrointestinal:  Negative for abdominal pain, constipation, diarrhea, nausea and vomiting.  Genitourinary:  Negative for dysuria and frequency.  Musculoskeletal:  Negative for arthralgias, back pain, joint swelling and neck pain.  Skin:  Negative for rash.  Neurological: Negative.  Negative for tremors and numbness.  Hematological:  Negative for adenopathy. Does not bruise/bleed easily.  Psychiatric/Behavioral:  Negative for behavioral problems (Depression), sleep disturbance and suicidal ideas. The patient is not nervous/anxious.     Vital Signs: BP 135/80   Pulse 70   Temp 98.2 F (36.8 C)   Resp 16   Ht 6\' 2"  (1.88 m)   Wt 222 lb 12.8 oz (101.1 kg)   SpO2 98%   BMI 28.61 kg/m    Physical Exam Vitals and nursing note reviewed.  Constitutional:      General: He is not in acute distress.    Appearance: He is well-developed. He is not diaphoretic.  HENT:     Head: Normocephalic and atraumatic.  Eyes:     Pupils: Pupils are equal, round, and reactive to light.  Neck:     Thyroid: No thyromegaly.     Vascular: No JVD.     Trachea: No tracheal deviation.  Cardiovascular:     Rate and Rhythm: Normal rate and regular rhythm.     Heart sounds: Normal heart sounds. No murmur heard.    No friction rub. No gallop.  Pulmonary:     Effort: Pulmonary effort is normal. No respiratory distress.     Breath sounds: No wheezing or rales.  Chest:     Chest wall: No tenderness.  Musculoskeletal:        General: Normal range of motion.     Cervical back: Normal range of motion and neck supple.  Lymphadenopathy:     Cervical: No cervical adenopathy.  Skin:    General: Skin is warm and dry.  Neurological:     Mental Status: He is alert.  Psychiatric:        Behavior: Behavior normal.        Thought Content: Thought content normal.        Judgment: Judgment normal.        Assessment/Plan: 1.  Essential hypertension (Primary) Stable, continue current medication  2. Testicular hypofunction Will adjust dose for weekly injection instead of every other week, will get labs done - testosterone cypionate (DEPOTESTOTERONE CYPIONATE) 100 MG/ML injection; Inject 1 mL (100 mg total) into the muscle once a week. For IM use only  Dispense: 10 mL; Refill: 0  3. Cigarette smoker Continue to work on cessation, may continue wellbutrin - buPROPion (WELLBUTRIN XL) 150 MG 24 hr tablet; Take 1 tablet (150 mg  total) by mouth daily.  Dispense: 90 tablet; Refill: 1  4. Hyperlipidemia, unspecified hyperlipidemia type Continue crestor and will update labs   General Counseling: Hoke verbalizes understanding of the findings of todays visit and agrees with plan of treatment. I have discussed any further diagnostic evaluation that may be needed or ordered today. We also reviewed his medications today. he has been encouraged to call the office with any questions or concerns that should arise related to todays visit.    No orders of the defined types were placed in this encounter.   Meds ordered this encounter  Medications   buPROPion (WELLBUTRIN XL) 150 MG 24 hr tablet    Sig: Take 1 tablet (150 mg total) by mouth daily.    Dispense:  90 tablet    Refill:  1   testosterone cypionate (DEPOTESTOTERONE CYPIONATE) 100 MG/ML injection    Sig: Inject 1 mL (100 mg total) into the muscle once a week. For IM use only    Dispense:  10 mL    Refill:  0    This patient was seen by Lynn Ito, PA-C in collaboration with Dr. Beverely Risen as a part of collaborative care agreement.   Total time spent:30 Minutes Time spent includes review of chart, medications, test results, and follow up plan with the patient.      Dr Lyndon Code Internal medicine

## 2023-05-18 LAB — LIPID PANEL WITH LDL/HDL RATIO
Cholesterol, Total: 187 mg/dL (ref 100–199)
HDL: 53 mg/dL
LDL Chol Calc (NIH): 109 mg/dL — ABNORMAL HIGH (ref 0–99)
LDL/HDL Ratio: 2.1 ratio (ref 0.0–3.6)
Triglycerides: 145 mg/dL (ref 0–149)
VLDL Cholesterol Cal: 25 mg/dL (ref 5–40)

## 2023-05-18 LAB — TESTOSTERONE,FREE AND TOTAL
Testosterone, Free: 18.4 pg/mL (ref 6.8–21.5)
Testosterone: 455 ng/dL (ref 264–916)

## 2023-05-21 ENCOUNTER — Telehealth: Payer: Self-pay

## 2023-05-21 NOTE — Telephone Encounter (Signed)
 LVM and sent MyChart message regarding lab results.

## 2023-05-21 NOTE — Telephone Encounter (Signed)
-----   Message from Carlean Jews sent at 05/21/2023 12:50 PM EST ----- Please let him know that his cholesterol did go up some from last year and ensure he continues to work on this and take his crestor daily. If not improving next check may need to increase dose. Testosterone levels normal.

## 2023-07-13 ENCOUNTER — Other Ambulatory Visit: Payer: Self-pay | Admitting: Physician Assistant

## 2023-07-13 DIAGNOSIS — E291 Testicular hypofunction: Secondary | ICD-10-CM

## 2023-07-17 ENCOUNTER — Other Ambulatory Visit: Payer: Self-pay | Admitting: Physician Assistant

## 2023-07-17 DIAGNOSIS — E291 Testicular hypofunction: Secondary | ICD-10-CM

## 2023-08-14 ENCOUNTER — Ambulatory Visit (INDEPENDENT_AMBULATORY_CARE_PROVIDER_SITE_OTHER): Admitting: Nurse Practitioner

## 2023-08-14 ENCOUNTER — Encounter: Payer: Self-pay | Admitting: Nurse Practitioner

## 2023-08-14 VITALS — BP 138/88 | HR 83 | Temp 98.6°F | Resp 16 | Ht 74.0 in | Wt 221.2 lb

## 2023-08-14 DIAGNOSIS — I10 Essential (primary) hypertension: Secondary | ICD-10-CM | POA: Diagnosis not present

## 2023-08-14 DIAGNOSIS — Z025 Encounter for examination for participation in sport: Secondary | ICD-10-CM

## 2023-08-14 DIAGNOSIS — Y9315 Activity, underwater diving and snorkeling: Secondary | ICD-10-CM

## 2023-08-14 NOTE — Progress Notes (Unsigned)
 [facility] [address] [place], [place] [address]  Internal MEDICINE  Office Visit Note  Patient Name: Stephen Lowery  [id number]  [id number]  Date of Service: 08/14/2023  Chief Complaint  Patient presents with   Hyperlipidemia   Hypertension   Follow-up    Form filled out     HPI Stephen Lowery presents for a follow-up visit for  Planning to get certified for scuba diving     Current Medication: Outpatient Encounter Medications as of 08/14/2023  Medication Sig   albuterol  (VENTOLIN  HFA) 108 (90 Base) MCG/ACT inhaler Inhale 1-2 puffs into the lungs every 6 (six) hours as needed.   amLODipine  (NORVASC ) 10 MG tablet TAKE ONE TABLET BY MOUTH ONE TIME DAILY   B-D INTEGRA SYRINGE 22G X 1-1/2" 3 ML MISC To use for testosterone  injections.   buPROPion  (WELLBUTRIN  XL) 150 MG 24 hr tablet Take 1 tablet (150 mg total) by mouth daily.   cloNIDine  (CATAPRES ) 0.1 MG tablet Take 1 tablet (0.1 mg total) by mouth 2 (two) times daily as needed.   losartan  (COZAAR ) 100 MG tablet TAKE ONE TABLET BY MOUTH ONE TIME DAILY   minocycline  (MINOCIN ) 100 MG capsule TAKE ONE CAPSULE BY MOUTH ONE TIME DAILY FOR ACNE   rosuvastatin  (CRESTOR ) 10 MG tablet TAKE ONE TABLET BY MOUTH ONE TIME DAILY   testosterone  cypionate (DEPOTESTOTERONE CYPIONATE) 100 MG/ML injection INJECT 1 ML INTRAMUSCULARLY ONCE WEEKLY - DISCARD REMAINDER OF MEDICATION WITHIN 28 DAYS   No facility-administered encounter medications on file as of 08/14/2023.    Surgical History: Past Surgical History:  Procedure Laterality Date   right hip replacement Right 06/17/2014    Medical History: Past Medical History:  Diagnosis Date   Anxiety    Hyperlipidemia    Hypertension     Family History: Family History  Problem Relation Age of Onset   Hypertension Mother    Hyperlipidemia Father    Hypertension Father     Social History   Socioeconomic History   Marital status: Married    Spouse name: Not on file   Number of  children: Not on file   Years of education: Not on file   Highest education level: Not on file  Occupational History   Not on file  Tobacco Use   Smoking status: Every Day    Current packs/day: 1.00    Average packs/day: 1 pack/day for 5.9 years (5.9 ttl pk-yrs)    Types: Cigarettes    Start date: 09/03/2017   Smokeless tobacco: Current    Types: Snuff   Tobacco comments:    1 pack daily  Substance and Sexual Activity   Alcohol use: Yes    Comment: social   Drug use: No   Sexual activity: Not on file  Other Topics Concern   Not on file  Social History Narrative   Not on file   Social Drivers of Health   Financial Resource Strain: Not on file  Food Insecurity: Not on file  Transportation Needs: Not on file  Physical Activity: Not on file  Stress: Not on file  Social Connections: Not on file  Intimate Partner Violence: Not on file      Review of Systems  Vital Signs: BP 138/88   Pulse 83   Temp 98.6 F (37 C)   Resp 16   Ht 6\' 2"  (1.88 m)   Wt 221 lb 3.2 oz (100.3 kg)   SpO2 99%   BMI 28.40 kg/m    Physical Exam  Assessment/Plan:   General Counseling: Breeze verbalizes understanding of the findings of todays visit and agrees with plan of treatment. I have discussed any further diagnostic evaluation that may be needed or ordered today. We also reviewed his medications today. he has been encouraged to call the office with any questions or concerns that should arise related to todays visit.    No orders of the defined types were placed in this encounter.   No orders of the defined types were placed in this encounter.   No follow-ups on file.   Total time spent:*** Minutes Time spent includes review of chart, medications, test results, and follow up plan with the patient.   [place] Controlled Substance Database was reviewed by me.  This patient was seen by Laurence Pons, FNP-C in collaboration with Dr. Verneta Gone as a part of collaborative care  agreement.   Blannie Shedlock R. Bobbi Burow, MSN, FNP-C Internal medicine

## 2023-08-26 ENCOUNTER — Other Ambulatory Visit: Payer: Self-pay | Admitting: Physician Assistant

## 2023-08-26 DIAGNOSIS — E291 Testicular hypofunction: Secondary | ICD-10-CM

## 2023-09-05 ENCOUNTER — Encounter: Payer: BLUE CROSS/BLUE SHIELD | Admitting: Physician Assistant

## 2023-09-22 ENCOUNTER — Encounter: Payer: Self-pay | Admitting: Nurse Practitioner

## 2023-10-08 ENCOUNTER — Telehealth: Payer: Self-pay | Admitting: Physician Assistant

## 2023-10-08 NOTE — Telephone Encounter (Signed)
 Left vm and sent mychart message to confirm 10/14/23 appointment-[name]

## 2023-10-14 ENCOUNTER — Encounter: Payer: Self-pay | Admitting: Physician Assistant

## 2023-10-14 ENCOUNTER — Ambulatory Visit (INDEPENDENT_AMBULATORY_CARE_PROVIDER_SITE_OTHER): Admitting: Physician Assistant

## 2023-10-14 VITALS — BP 136/90 | HR 82 | Temp 98.3°F | Resp 16 | Ht 74.0 in | Wt 223.0 lb

## 2023-10-14 DIAGNOSIS — Z1329 Encounter for screening for other suspected endocrine disorder: Secondary | ICD-10-CM | POA: Diagnosis not present

## 2023-10-14 DIAGNOSIS — E785 Hyperlipidemia, unspecified: Secondary | ICD-10-CM | POA: Diagnosis not present

## 2023-10-14 DIAGNOSIS — E291 Testicular hypofunction: Secondary | ICD-10-CM | POA: Diagnosis not present

## 2023-10-14 DIAGNOSIS — Z0001 Encounter for general adult medical examination with abnormal findings: Secondary | ICD-10-CM

## 2023-10-14 DIAGNOSIS — I10 Essential (primary) hypertension: Secondary | ICD-10-CM

## 2023-10-14 DIAGNOSIS — R5383 Other fatigue: Secondary | ICD-10-CM

## 2023-10-14 DIAGNOSIS — Z125 Encounter for screening for malignant neoplasm of prostate: Secondary | ICD-10-CM

## 2023-10-14 MED ORDER — TESTOSTERONE CYPIONATE 100 MG/ML IM SOLN
100.0000 mg | INTRAMUSCULAR | 0 refills | Status: DC
Start: 2023-10-14 — End: 2023-11-26

## 2023-10-14 NOTE — Progress Notes (Signed)
 [facility] [address] [place], [place] [address]  Internal MEDICINE  Office Visit Note  Patient Name: Stephen Lowery  [id number]  [id number]  Date of Service: 10/14/2023  Chief Complaint  Patient presents with   Hyperlipidemia   Hypertension   Annual Exam     HPI Pt is here for routine health maintenance examination -taking crestor  most days (5-6 days per week now), LDL a little elevated last check, due for repeat with labs soon -doing well with testosterone  dosing weekly now at lower dose, has extra he throws out but likes this better than previous dosing every other week -has not been checking BP recently and will start, he did have caffeine this morning before visit -some ruminating at night, but once asleep does well. States he tried tylenol PM and it jsut made him groggy the next day. Discussed active thinking techniques -taking viking cruise in [place] in October with his wife -due for labs  Current Medication: Outpatient Encounter Medications as of 10/14/2023  Medication Sig   albuterol  (VENTOLIN  HFA) 108 (90 Base) MCG/ACT inhaler Inhale 1-2 puffs into the lungs every 6 (six) hours as needed.   amLODipine  (NORVASC ) 10 MG tablet TAKE ONE TABLET BY MOUTH ONE TIME DAILY   B-D INTEGRA SYRINGE 22G X 1-1/2 3 ML MISC To use for testosterone  injections.   buPROPion  (WELLBUTRIN  XL) 150 MG 24 hr tablet Take 1 tablet (150 mg total) by mouth daily.   losartan  (COZAAR ) 100 MG tablet TAKE ONE TABLET BY MOUTH ONE TIME DAILY   minocycline  (MINOCIN ) 100 MG capsule TAKE ONE CAPSULE BY MOUTH ONE TIME DAILY FOR ACNE   rosuvastatin  (CRESTOR ) 10 MG tablet TAKE ONE TABLET BY MOUTH ONE TIME DAILY   testosterone  cypionate (DEPOTESTOTERONE CYPIONATE) 100 MG/ML injection Inject 1 mL (100 mg total) into the muscle once a week. For IM use onlyINJECT ONE ML INTRAMUSCULARLY EVERY WEEK *DISCARD REMAINDER OF MEDICATION 28 DAYS AFTER INITIAL USE*   [DISCONTINUED] cloNIDine  (CATAPRES ) 0.1 MG  tablet Take 1 tablet (0.1 mg total) by mouth 2 (two) times daily as needed.   [DISCONTINUED] testosterone  cypionate (DEPOTESTOTERONE CYPIONATE) 100 MG/ML injection INJECT ONE ML INTRAMUSCULARLY EVERY WEEK *DISCARD REMAINDER OF MEDICATION 28 DAYS AFTER INITIAL USE*   No facility-administered encounter medications on file as of 10/14/2023.    Surgical History: Past Surgical History:  Procedure Laterality Date   right hip replacement Right 06/17/2014    Medical History: Past Medical History:  Diagnosis Date   Anxiety    Hyperlipidemia    Hypertension     Family History: Family History  Problem Relation Age of Onset   Hypertension Mother    Hyperlipidemia Father    Hypertension Father       Review of Systems  Constitutional:  Negative for chills, fatigue and unexpected weight change.  HENT:  Negative for congestion, postnasal drip, rhinorrhea, sneezing and sore throat.   Eyes:  Negative for redness.  Respiratory:  Negative for cough, chest tightness and shortness of breath.   Cardiovascular:  Negative for chest pain and palpitations.  Gastrointestinal:  Negative for abdominal pain, constipation, diarrhea, nausea and vomiting.  Genitourinary:  Negative for dysuria and frequency.  Musculoskeletal:  Negative for arthralgias, back pain, joint swelling and neck pain.  Skin:  Negative for rash.  Neurological: Negative.  Negative for tremors and numbness.  Hematological:  Negative for adenopathy. Does not bruise/bleed easily.  Psychiatric/Behavioral:  Positive for sleep disturbance. Negative for behavioral problems (Depression) and suicidal ideas. The patient  is not nervous/anxious.      Vital Signs: BP (!) 136/90   Pulse 82   Temp 98.3 F (36.8 C)   Resp 16   Ht 6' 2 (1.88 m)   Wt 223 lb (101.2 kg)   SpO2 99%   BMI 28.63 kg/m    Physical Exam Vitals and nursing note reviewed.  Constitutional:      General: He is not in acute distress.    Appearance: Normal  appearance. He is well-developed. He is not diaphoretic.  HENT:     Head: Normocephalic and atraumatic.  Eyes:     Extraocular Movements: Extraocular movements intact.  Neck:     Thyroid : No thyromegaly.     Vascular: No JVD.     Trachea: No tracheal deviation.  Cardiovascular:     Rate and Rhythm: Normal rate and regular rhythm.     Heart sounds: Normal heart sounds. No murmur heard.    No friction rub. No gallop.  Pulmonary:     Effort: Pulmonary effort is normal. No respiratory distress.     Breath sounds: No wheezing or rales.  Chest:     Chest wall: No tenderness.  Abdominal:     General: Bowel sounds are normal.     Tenderness: There is no abdominal tenderness.  Musculoskeletal:        General: Normal range of motion.     Cervical back: Normal range of motion and neck supple.  Lymphadenopathy:     Cervical: No cervical adenopathy.  Skin:    General: Skin is warm and dry.  Neurological:     Mental Status: He is alert.  Psychiatric:        Behavior: Behavior normal.        Thought Content: Thought content normal.        Judgment: Judgment normal.      LABS: No results found for this or any previous visit (from the past 2160 hours).      Assessment/Plan: 1. Encounter for general adult medical examination with abnormal findings (Primary) CPE performed, labs ordered, cologuard UTD  2. Essential hypertension Borderline in office and will start monitoring at home again.   3. Testicular hypofunction - testosterone  cypionate (DEPOTESTOTERONE CYPIONATE) 100 MG/ML injection; Inject 1 mL (100 mg total) into the muscle once a week. For IM use onlyINJECT ONE ML INTRAMUSCULARLY EVERY WEEK *DISCARD REMAINDER OF MEDICATION 28 DAYS AFTER INITIAL USE*  Dispense: 10 mL; Refill: 0 - Testosterone ,Free and Total  4. Hyperlipidemia, unspecified hyperlipidemia type Continue crestor  and will check labs - Lipid Panel With LDL/HDL Ratio  5. Thyroid  disorder screen - TSH + free  T4  6. Special screening for malignant neoplasm of prostate - PSA Total (Reflex To Free)  7. Other fatigue - testosterone  cypionate (DEPOTESTOTERONE CYPIONATE) 100 MG/ML injection; Inject 1 mL (100 mg total) into the muscle once a week. For IM use onlyINJECT ONE ML INTRAMUSCULARLY EVERY WEEK *DISCARD REMAINDER OF MEDICATION 28 DAYS AFTER INITIAL USE*  Dispense: 10 mL; Refill: 0 - Testosterone ,Free and Total - CBC w/Diff/Platelet - TSH + free T4 - Lipid Panel With LDL/HDL Ratio - Comprehensive metabolic panel with GFR - PSA Total (Reflex To Free)   General Counseling: Zacharia verbalizes understanding of the findings of todays visit and agrees with plan of treatment. I have discussed any further diagnostic evaluation that may be needed or ordered today. We also reviewed his medications today. he has been encouraged to call the office with any questions or concerns  that should arise related to todays visit.    Counseling:    Orders Placed This Encounter  Procedures   Testosterone ,Free and Total   CBC w/Diff/Platelet   TSH + free T4   Lipid Panel With LDL/HDL Ratio   Comprehensive metabolic panel with GFR   PSA Total (Reflex To Free)    Meds ordered this encounter  Medications   testosterone  cypionate (DEPOTESTOTERONE CYPIONATE) 100 MG/ML injection    Sig: Inject 1 mL (100 mg total) into the muscle once a week. For IM use onlyINJECT ONE ML INTRAMUSCULARLY EVERY WEEK *DISCARD REMAINDER OF MEDICATION 28 DAYS AFTER INITIAL USE*    Dispense:  10 mL    Refill:  0    This patient was seen by Tinnie Pro, PA-C in collaboration with Dr. Sigrid Bathe as a part of collaborative care agreement.  Total time spent:35 Minutes  Time spent includes review of chart, medications, test results, and follow up plan with the patient.     Sigrid CHRISTELLA Bathe, MD  Internal Medicine

## 2023-11-18 ENCOUNTER — Ambulatory Visit: Admitting: Physician Assistant

## 2023-11-25 ENCOUNTER — Other Ambulatory Visit: Payer: Self-pay | Admitting: Physician Assistant

## 2023-11-25 DIAGNOSIS — E291 Testicular hypofunction: Secondary | ICD-10-CM

## 2023-11-25 DIAGNOSIS — R5383 Other fatigue: Secondary | ICD-10-CM

## 2023-11-26 ENCOUNTER — Other Ambulatory Visit: Payer: Self-pay | Admitting: Physician Assistant

## 2023-11-26 DIAGNOSIS — E291 Testicular hypofunction: Secondary | ICD-10-CM

## 2023-11-26 DIAGNOSIS — R5383 Other fatigue: Secondary | ICD-10-CM

## 2023-11-26 NOTE — Telephone Encounter (Signed)
 Next 12/05/23 and last no show

## 2023-12-05 ENCOUNTER — Encounter: Payer: Self-pay | Admitting: Physician Assistant

## 2023-12-05 ENCOUNTER — Ambulatory Visit (INDEPENDENT_AMBULATORY_CARE_PROVIDER_SITE_OTHER): Admitting: Physician Assistant

## 2023-12-05 VITALS — BP 125/85 | HR 112 | Temp 98.0°F | Resp 16 | Ht 74.0 in | Wt 224.0 lb

## 2023-12-05 DIAGNOSIS — E291 Testicular hypofunction: Secondary | ICD-10-CM

## 2023-12-05 DIAGNOSIS — I10 Essential (primary) hypertension: Secondary | ICD-10-CM

## 2023-12-05 DIAGNOSIS — D352 Benign neoplasm of pituitary gland: Secondary | ICD-10-CM

## 2023-12-05 DIAGNOSIS — D582 Other hemoglobinopathies: Secondary | ICD-10-CM

## 2023-12-05 DIAGNOSIS — S59901A Unspecified injury of right elbow, initial encounter: Secondary | ICD-10-CM

## 2023-12-05 DIAGNOSIS — G4733 Obstructive sleep apnea (adult) (pediatric): Secondary | ICD-10-CM

## 2023-12-05 NOTE — Progress Notes (Signed)
 [facility] [address] [place], [place] [address]  Internal MEDICINE  Office Visit Note  Patient Name: Stephen Lowery  [id number]  [id number]  Date of Service: 12/05/2023  Chief Complaint  Patient presents with   Follow-up    Review labs   Hypertension   Hyperlipidemia   Quality Metric Gaps    Colonoscopy   Elbow Pain    Right elbow pain from arm wrestling, heard a popping noise    HPI Pt is here for routine follow up -going to [place] next week, then [place] on cruise in Nov -Arm wrestling after some drinks and heard right elbow pop. Didn't bother him then, but next morning it hurt very badly and could not move it. Did not really try anything for it. This was over a month ago. Now can move but limited flexion beyond 90 degrees without significant pain and can't fully extend. Will think about ortho if not improving, but wants to hold off for now. May need MRI -did have a fall back on roof and hit elbow. Did not hit head -snoring loudly again, hx of OSA and stopped CPAP after weight loss but symptoms returning now. Also hemoglobin elevated now and discussed this is common with untreated apnea as well as smoking. Encouraged smoking cessation as well -labs look good otherwise -requests new endo referral to establish care to monitor his pituitary tumor.  EPWORTH SLEEPINESS SCALE:  Scale:  (0)= no chance of dozing; (1)= slight chance of dozing; (2)= moderate chance of dozing; (3)= high chance of dozing  Chance  Situtation    Sitting and reading: 0    Watching TV: 1    Sitting Inactive in public: 0    As a passenger in car: 0      Lying down to rest: 3    Sitting and talking: 0    Sitting quielty after lunch: 0    In a car, stopped in traffic: 0   TOTAL SCORE:   4 out of 24   Current Medication: Outpatient Encounter Medications as of 12/05/2023  Medication Sig   albuterol  (VENTOLIN  HFA) 108 (90 Base) MCG/ACT inhaler Inhale 1-2 puffs into the lungs  every 6 (six) hours as needed.   amLODipine  (NORVASC ) 10 MG tablet TAKE ONE TABLET BY MOUTH ONE TIME DAILY   B-D INTEGRA SYRINGE 22G X 1-1/2 3 ML MISC To use for testosterone  injections.   buPROPion  (WELLBUTRIN  XL) 150 MG 24 hr tablet Take 1 tablet (150 mg total) by mouth daily.   losartan  (COZAAR ) 100 MG tablet TAKE ONE TABLET BY MOUTH ONE TIME DAILY   minocycline  (MINOCIN ) 100 MG capsule TAKE ONE CAPSULE BY MOUTH ONE TIME DAILY FOR ACNE   rosuvastatin  (CRESTOR ) 10 MG tablet TAKE ONE TABLET BY MOUTH ONE TIME DAILY   testosterone  cypionate (DEPOTESTOTERONE CYPIONATE) 100 MG/ML injection INJECT 1ML INTRAMUSCULARLY ONCE WEEKLY *PLEASE DISCARD REMAINDER OF MEDICATION 28 DAYS AFTER INITIAL USE*   No facility-administered encounter medications on file as of 12/05/2023.    Surgical History: Past Surgical History:  Procedure Laterality Date   right hip replacement Right 06/17/2014    Medical History: Past Medical History:  Diagnosis Date   Anxiety    Hyperlipidemia    Hypertension     Family History: Family History  Problem Relation Age of Onset   Hypertension Mother    Hyperlipidemia Father    Hypertension Father     Social History   Socioeconomic History   Marital status: Married  Spouse name: Not on file   Number of children: Not on file   Years of education: Not on file   Highest education level: Not on file  Occupational History   Not on file  Tobacco Use   Smoking status: Every Day    Current packs/day: 1.00    Average packs/day: 1 pack/day for 6.3 years (6.3 ttl pk-yrs)    Types: Cigarettes    Start date: 09/03/2017   Smokeless tobacco: Current    Types: Snuff   Tobacco comments:    1 pack daily  Substance and Sexual Activity   Alcohol use: Yes    Comment: social   Drug use: No   Sexual activity: Not on file  Other Topics Concern   Not on file  Social History Narrative   Not on file   Social Drivers of Health   Financial Resource Strain: Not on file   Food Insecurity: Not on file  Transportation Needs: Not on file  Physical Activity: Not on file  Stress: Not on file  Social Connections: Not on file  Intimate Partner Violence: Not on file      Review of Systems  Constitutional:  Negative for chills, fatigue and unexpected weight change.  HENT:  Negative for congestion, postnasal drip, rhinorrhea, sneezing and sore throat.   Eyes:  Negative for redness.  Respiratory:  Negative for cough, chest tightness and shortness of breath.   Cardiovascular:  Negative for chest pain and palpitations.  Gastrointestinal:  Negative for abdominal pain, constipation, diarrhea, nausea and vomiting.  Genitourinary:  Negative for dysuria and frequency.  Musculoskeletal:  Positive for arthralgias. Negative for back pain, joint swelling and neck pain.  Skin:  Negative for rash.  Neurological: Negative.  Negative for tremors and numbness.  Hematological:  Negative for adenopathy. Does not bruise/bleed easily.  Psychiatric/Behavioral:  Positive for sleep disturbance. Negative for behavioral problems (Depression) and suicidal ideas. The patient is not nervous/anxious.     Vital Signs: BP 125/85   Pulse (!) 112   Temp 98 F (36.7 C)   Resp 16   Ht 6' 2 (1.88 m)   Wt 224 lb (101.6 kg)   SpO2 99%   BMI 28.76 kg/m    Physical Exam Vitals and nursing note reviewed.  Constitutional:      General: He is not in acute distress.    Appearance: Normal appearance. He is well-developed. He is not diaphoretic.  HENT:     Head: Normocephalic and atraumatic.  Eyes:     Extraocular Movements: Extraocular movements intact.  Neck:     Thyroid : No thyromegaly.     Vascular: No JVD.     Trachea: No tracheal deviation.  Cardiovascular:     Rate and Rhythm: Normal rate and regular rhythm.     Heart sounds: Normal heart sounds. No murmur heard.    No friction rub. No gallop.  Pulmonary:     Effort: Pulmonary effort is normal. No respiratory distress.      Breath sounds: No wheezing or rales.  Chest:     Chest wall: No tenderness.  Musculoskeletal:        General: Signs of injury present. No swelling, tenderness or deformity.     Comments: Limited ROM of right elbow both extension and flexion. Able to pronate/supinate. No obvious deformities or tenderness  Skin:    General: Skin is warm and dry.  Neurological:     Mental Status: He is alert.  Psychiatric:  Behavior: Behavior normal.        Thought Content: Thought content normal.        Judgment: Judgment normal.        Assessment/Plan: 1. Essential hypertension Stable, continue current medication  2. Pituitary microadenoma (HCC) (Primary) Needs new endo referral to monitor - Ambulatory referral to Endocrinology  3. Testicular hypofunction Continue testosterone  as before, labs updated - Ambulatory referral to Endocrinology  4. OSA (obstructive sleep apnea) Will move forward with retesting as pt has hx of OSA without recent PAP use and has recurence of snoring, and faitgue. - PS[facility] SLEEP STUDY; Future  5. Elevated hemoglobin (HCC) Will recheck labs but likely due to OSA and smoking - CBC w/Diff/Platelet  6. Elbow injury, right, initial encounter Advised on antiinflammatories and ice and to consider ortho eval. He will think about ortho and follow up if needed. Discussed he may need MRI if not improving given loud pop at time of injury.   General Counseling: Lord verbalizes understanding of the findings of todays visit and agrees with plan of treatment. I have discussed any further diagnostic evaluation that may be needed or ordered today. We also reviewed his medications today. he has been encouraged to call the office with any questions or concerns that should arise related to todays visit.    Orders Placed This Encounter  Procedures   CBC w/Diff/Platelet   Ambulatory referral to Endocrinology   PSG SLEEP STUDY    No orders of the defined types were placed in  this encounter.   This patient was seen by Tinnie Pro, PA-C in collaboration with Dr. Sigrid Bathe as a part of collaborative care agreement.   Total time spent:30 Minutes Time spent includes review of chart, medications, test results, and follow up plan with the patient.      Dr Fozia M Khan Internal medicine

## 2023-12-06 ENCOUNTER — Telehealth: Payer: Self-pay | Admitting: Physician Assistant

## 2023-12-06 NOTE — Telephone Encounter (Signed)
 Awaiting 12/05/23 office notes for SS order-[name]

## 2023-12-06 NOTE — Telephone Encounter (Signed)
 Awaiting 12/05/23 office notes for Endocrinology referral-[name]

## 2023-12-10 LAB — LIPID PANEL WITH LDL/HDL RATIO
Cholesterol, Total: 159 mg/dL (ref 100–199)
HDL: 62 mg/dL (ref >39)
LDL Chol Calc (NIH): 74 mg/dL (ref 0–99)
LDL/HDL Ratio: 1.2 ratio (ref 0.0–3.6)
Triglycerides: 135 mg/dL (ref 0–149)
VLDL Cholesterol Cal: 23 mg/dL (ref 5–40)

## 2023-12-10 LAB — COMPREHENSIVE METABOLIC PANEL WITH GFR
ALT: 39 IU/L (ref 0–44)
AST: 34 IU/L (ref 0–40)
Albumin: 4.8 g/dL (ref 4.1–5.1)
Alkaline Phosphatase: 84 IU/L (ref 44–121)
BUN/Creatinine Ratio: 10 (ref 9–20)
BUN: 11 mg/dL (ref 6–24)
Bilirubin Total: 1 mg/dL (ref 0.0–1.2)
CO2: 23 mmol/L (ref 20–29)
Calcium: 9.9 mg/dL (ref 8.7–10.2)
Chloride: 100 mmol/L (ref 96–106)
Creatinine, Ser: 1.12 mg/dL (ref 0.76–1.27)
Globulin, Total: 2.2 g/dL (ref 1.5–4.5)
Glucose: 81 mg/dL (ref 70–99)
Potassium: 4.1 mmol/L (ref 3.5–5.2)
Sodium: 140 mmol/L (ref 134–144)
Total Protein: 7 g/dL (ref 6.0–8.5)
eGFR: 82 mL/min/1.73 (ref >59)

## 2023-12-10 LAB — CBC WITH DIFFERENTIAL/PLATELET
Basophils Absolute: 0.1 x10E3/uL (ref 0.0–0.2)
Basos: 2 %
EOS (ABSOLUTE): 0.6 x10E3/uL — ABNORMAL HIGH (ref 0.0–0.4)
Eos: 10 %
Hematocrit: 53.3 % — ABNORMAL HIGH (ref 37.5–51.0)
Hemoglobin: 17.8 g/dL — ABNORMAL HIGH (ref 13.0–17.7)
Immature Grans (Abs): 0 x10E3/uL (ref 0.0–0.1)
Immature Granulocytes: 0 %
Lymphocytes Absolute: 1.7 x10E3/uL (ref 0.7–3.1)
Lymphs: 30 %
MCH: 33.1 pg — ABNORMAL HIGH (ref 26.6–33.0)
MCHC: 33.4 g/dL (ref 31.5–35.7)
MCV: 99 fL — ABNORMAL HIGH (ref 79–97)
Monocytes Absolute: 0.6 x10E3/uL (ref 0.1–0.9)
Monocytes: 10 %
Neutrophils Absolute: 2.7 x10E3/uL (ref 1.4–7.0)
Neutrophils: 48 %
Platelets: 233 x10E3/uL (ref 150–450)
RBC: 5.37 x10E6/uL (ref 4.14–5.80)
RDW: 13.1 % (ref 11.6–15.4)
WBC: 5.6 x10E3/uL (ref 3.4–10.8)

## 2023-12-10 LAB — TSH+FREE T4
Free T4: 1.15 ng/dL (ref 0.82–1.77)
TSH: 2.49 u[IU]/mL (ref 0.450–4.500)

## 2023-12-10 LAB — PSA TOTAL (REFLEX TO FREE): Prostate Specific Ag, Serum: 1.9 ng/mL (ref 0.0–4.0)

## 2023-12-10 LAB — TESTOSTERONE,FREE AND TOTAL
Testosterone, Free: 10.8 pg/mL (ref 6.8–21.5)
Testosterone: 383 ng/dL (ref 264–916)

## 2023-12-11 ENCOUNTER — Telehealth: Payer: Self-pay | Admitting: Physician Assistant

## 2023-12-11 NOTE — Telephone Encounter (Signed)
 SS order emailed to [facility]-[name]

## 2023-12-12 ENCOUNTER — Telehealth: Payer: Self-pay | Admitting: Physician Assistant

## 2023-12-12 NOTE — Telephone Encounter (Signed)
 Endocrinology referral faxed to the [facility]; [phone]. Lvm notifying patient. Gave pt telephone [phone]

## 2023-12-19 ENCOUNTER — Other Ambulatory Visit: Payer: Self-pay | Admitting: Physician Assistant

## 2023-12-19 DIAGNOSIS — F17210 Nicotine dependence, cigarettes, uncomplicated: Secondary | ICD-10-CM

## 2023-12-19 DIAGNOSIS — I10 Essential (primary) hypertension: Secondary | ICD-10-CM

## 2023-12-27 ENCOUNTER — Telehealth: Payer: Self-pay | Admitting: Physician Assistant

## 2023-12-27 NOTE — Telephone Encounter (Signed)
 Received Endocrinology referral back from [facility] requesting brain MRI or pituitary results before they can proceed with referral. S/w dfk, after she reviewed chart, she stated to hold off on referral. She will s/w patient-Stephen Lowery

## 2024-01-06 ENCOUNTER — Ambulatory Visit (INDEPENDENT_AMBULATORY_CARE_PROVIDER_SITE_OTHER): Admitting: Physician Assistant

## 2024-01-06 ENCOUNTER — Telehealth: Payer: Self-pay

## 2024-01-06 ENCOUNTER — Other Ambulatory Visit: Payer: Self-pay | Admitting: Physician Assistant

## 2024-01-06 ENCOUNTER — Encounter: Payer: Self-pay | Admitting: Physician Assistant

## 2024-01-06 VITALS — BP 147/86 | HR 83 | Temp 98.0°F | Resp 16 | Ht 74.0 in | Wt 224.0 lb

## 2024-01-06 DIAGNOSIS — R079 Chest pain, unspecified: Secondary | ICD-10-CM | POA: Diagnosis not present

## 2024-01-06 DIAGNOSIS — I10 Essential (primary) hypertension: Secondary | ICD-10-CM

## 2024-01-06 DIAGNOSIS — R5383 Other fatigue: Secondary | ICD-10-CM

## 2024-01-06 DIAGNOSIS — F17210 Nicotine dependence, cigarettes, uncomplicated: Secondary | ICD-10-CM | POA: Diagnosis not present

## 2024-01-06 DIAGNOSIS — K219 Gastro-esophageal reflux disease without esophagitis: Secondary | ICD-10-CM | POA: Diagnosis not present

## 2024-01-06 DIAGNOSIS — E291 Testicular hypofunction: Secondary | ICD-10-CM

## 2024-01-06 MED ORDER — OMEPRAZOLE 40 MG PO CPDR
40.0000 mg | DELAYED_RELEASE_CAPSULE | Freq: Every day | ORAL | 3 refills | Status: AC
Start: 2024-01-06 — End: ?

## 2024-01-06 NOTE — Progress Notes (Signed)
 [facility] [address] [place], [place] [address]  Internal MEDICINE  Office Visit Note  Patient Name: KARAN RAMNAUTH  [id number]  [id number]  Date of Service: 01/06/2024  Chief Complaint  Patient presents with   Acute Visit   Chest Pain     HPI Pt is here for a sick visit. -Intermittent CP with deep breaths for about 3 weeks. Actually states it was happening before travel to [place] -last night woke up in middle of night sweating and had CP and took gasx and tums and after felt better -Yesterday driving felt a little chest tightness but also felt like if he could burp it would feel better -last Thursday also woke in the middle of the night with similar CP -just started prilosec OTC 4 days ago, will send prescription strength -pt is a smoker and discussed higher risk factors with this -No SOB but pain with deep inhale. Denies CP with exertion. He did some yard work recently and felt fine -No wheezing or coughing, no recent illnesses. Otherwise feels well -CP substernal and does not radiate anywhere. No chest wall tenderness. -Denies anxiety contributing, though a little worried in office which likely contributes to higher BP today. Normally well controlled and will watch closely at home -Discussed he may need further workup in ED if new or worsening symptoms arise and he understands. Will go ahead and place urgent cardiology referral -He is supposed to go to [place] on cruise in 1 month currently  Current Medication:  Outpatient Encounter Medications as of 01/06/2024  Medication Sig   albuterol  (VENTOLIN  HFA) 108 (90 Base) MCG/ACT inhaler Inhale 1-2 puffs into the lungs every 6 (six) hours as needed.   amLODipine  (NORVASC ) 10 MG tablet TAKE ONE TABLET BY MOUTH ONE TIME DAILY   B-D INTEGRA SYRINGE 22G X 1-1/2 3 ML MISC To use for testosterone  injections.   buPROPion  (WELLBUTRIN  XL) 150 MG 24 hr tablet TAKE ONE TABLET BY MOUTH ONE TIME DAILY   losartan   (COZAAR ) 100 MG tablet TAKE ONE TABLET BY MOUTH ONE TIME DAILY   minocycline  (MINOCIN ) 100 MG capsule TAKE ONE CAPSULE BY MOUTH ONE TIME DAILY FOR ACNE   rosuvastatin  (CRESTOR ) 10 MG tablet TAKE ONE TABLET BY MOUTH ONE TIME DAILY   testosterone  cypionate (DEPOTESTOTERONE CYPIONATE) 100 MG/ML injection INJECT 1ML INTRAMUSCULARLY ONCE WEEKLY *PLEASE DISCARD REMAINDER OF MEDICATION 28 DAYS AFTER INITIAL USE*   No facility-administered encounter medications on file as of 01/06/2024.      Medical History: Past Medical History:  Diagnosis Date   Anxiety    Hyperlipidemia    Hypertension      Vital Signs: BP (!) 147/86   Pulse 83   Temp 98 F (36.7 C)   Resp 16   Ht 6' 2 (1.88 m)   Wt 224 lb (101.6 kg)   SpO2 99%   BMI 28.76 kg/m    Review of Systems  Constitutional:  Negative for fatigue and fever.  HENT:  Negative for congestion, mouth sores and postnasal drip.   Respiratory:  Positive for chest tightness. Negative for cough and shortness of breath.        CP with deep breaths  Cardiovascular:  Positive for chest pain. Negative for palpitations.  Gastrointestinal:  Negative for constipation and diarrhea.  Genitourinary:  Negative for flank pain.  Skin:  Negative for rash.  Neurological:  Negative for syncope.  Psychiatric/Behavioral: Negative.  Negative for behavioral problems. The patient is not nervous/anxious.  Physical Exam Vitals and nursing note reviewed.  Constitutional:      General: He is not in acute distress.    Appearance: Normal appearance. He is well-developed. He is not diaphoretic.  HENT:     Head: Normocephalic and atraumatic.  Eyes:     Extraocular Movements: Extraocular movements intact.  Neck:     Thyroid : No thyromegaly.     Vascular: No JVD.     Trachea: No tracheal deviation.  Cardiovascular:     Rate and Rhythm: Normal rate and regular rhythm.     Heart sounds: Normal heart sounds. No murmur heard.    No friction rub. No gallop.   Pulmonary:     Effort: Pulmonary effort is normal. No respiratory distress.     Breath sounds: Normal breath sounds. No wheezing or rales.  Chest:     Chest wall: No tenderness.  Abdominal:     Tenderness: There is no abdominal tenderness.  Skin:    General: Skin is warm and dry.  Neurological:     Mental Status: He is alert and oriented to person, place, and time.  Psychiatric:        Behavior: Behavior normal.        Thought Content: Thought content normal.        Judgment: Judgment normal.       Assessment/Plan: 1. Chest pain, unspecified type (Primary) Advised to go to ED if any new or worsening symptoms. Will place urgent cardiology referral. Will also update chest xray given CP with deep inspiration and check OP labs in the meantime. Does have PSG pending for OSA re-evaluation. - EKG 12-Lead - Ambulatory referral to Cardiology - DG Chest 2 View; Future - Troponin T - D-Dimer, Quantitative - CBC w/Diff/Platelet  2. Gastroesophageal reflux disease, unspecified whether esophagitis present Will start on omeprazole 40mg  due to possible reflux contributing - omeprazole (PRILOSEC) 40 MG capsule; Take 1 capsule (40 mg total) by mouth daily.  Dispense: 30 capsule; Refill: 3  3. Cigarette smoker Discussed increased RF due to this and continue to work on cessation  4. Essential hypertension Elevated in office due to nerves over evaluation and will monitor closely at home   General Counseling: laderrick wilk understanding of the findings of todays visit and agrees with plan of treatment. I have discussed any further diagnostic evaluation that may be needed or ordered today. We also reviewed his medications today. he has been encouraged to call the office with any questions or concerns that should arise related to todays visit.    Counseling:    Orders Placed This Encounter  Procedures   EKG 12-Lead    No orders of the defined types were placed in this  encounter.   Time spent:45 Minutes

## 2024-01-06 NOTE — Telephone Encounter (Signed)
 Pt called that he is having chest  pain and shortness of pain advised as per lauren go to ED he like to wait for appt as per pt id going on for 2 weeks on and off may be gas

## 2024-01-07 ENCOUNTER — Telehealth: Payer: Self-pay

## 2024-01-07 NOTE — Telephone Encounter (Signed)
 Tried to reach patient and wife multiple times regarding additional testing that needs to be done per Lauren.

## 2024-01-16 ENCOUNTER — Telehealth: Payer: Self-pay

## 2024-01-17 ENCOUNTER — Telehealth: Payer: Self-pay

## 2024-01-17 ENCOUNTER — Other Ambulatory Visit: Payer: Self-pay | Admitting: Physician Assistant

## 2024-01-17 DIAGNOSIS — R079 Chest pain, unspecified: Secondary | ICD-10-CM

## 2024-01-17 DIAGNOSIS — D582 Other hemoglobinopathies: Secondary | ICD-10-CM

## 2024-01-17 NOTE — Telephone Encounter (Addendum)
 Notified patient and his wife regarding chest x-ray needing to be done before Lauren can confirm that sending Testosterone  is ok. Pt will also get labs drawn either today or Monday.

## 2024-01-17 NOTE — Telephone Encounter (Signed)
 Pt wife advised by ruel that we will refill pres after Ct scan result

## 2024-01-19 LAB — CBC WITH DIFFERENTIAL/PLATELET
Basophils Absolute: 0.1 x10E3/uL (ref 0.0–0.2)
Basos: 1 %
EOS (ABSOLUTE): 0.5 x10E3/uL — ABNORMAL HIGH (ref 0.0–0.4)
Eos: 7 %
Hematocrit: 51.1 % — ABNORMAL HIGH (ref 37.5–51.0)
Hemoglobin: 17.3 g/dL (ref 13.0–17.7)
Immature Grans (Abs): 0 x10E3/uL (ref 0.0–0.1)
Immature Granulocytes: 0 %
Lymphocytes Absolute: 2 x10E3/uL (ref 0.7–3.1)
Lymphs: 33 %
MCH: 33.7 pg — ABNORMAL HIGH (ref 26.6–33.0)
MCHC: 33.9 g/dL (ref 31.5–35.7)
MCV: 100 fL — ABNORMAL HIGH (ref 79–97)
Monocytes Absolute: 0.5 x10E3/uL (ref 0.1–0.9)
Monocytes: 9 %
Neutrophils Absolute: 3.1 x10E3/uL (ref 1.4–7.0)
Neutrophils: 50 %
Platelets: 246 x10E3/uL (ref 150–450)
RBC: 5.13 x10E6/uL (ref 4.14–5.80)
RDW: 12.6 % (ref 11.6–15.4)
WBC: 6.2 x10E3/uL (ref 3.4–10.8)

## 2024-01-19 LAB — TROPONIN T: Troponin T (Highly Sensitive): <6 ng/L (ref 0–22)

## 2024-01-19 LAB — D-DIMER, QUANTITATIVE: D-DIMER: 0.2 mg{FEU}/L (ref 0.00–0.49)

## 2024-01-20 ENCOUNTER — Ambulatory Visit: Payer: Self-pay | Admitting: Physician Assistant

## 2024-01-20 ENCOUNTER — Other Ambulatory Visit: Payer: Self-pay | Admitting: Physician Assistant

## 2024-01-20 DIAGNOSIS — E291 Testicular hypofunction: Secondary | ICD-10-CM

## 2024-01-20 DIAGNOSIS — R5383 Other fatigue: Secondary | ICD-10-CM

## 2024-01-20 DIAGNOSIS — D352 Benign neoplasm of pituitary gland: Secondary | ICD-10-CM

## 2024-01-20 MED ORDER — TESTOSTERONE CYPIONATE 100 MG/ML IM SOLN: 100.0000 mg | INTRAMUSCULAR | 0 refills | Status: DC

## 2024-01-20 NOTE — Telephone Encounter (Signed)
-----   Message from Tinnie MARLA Pro sent at 01/20/2024 12:58 PM EDT ----- Please let him know that his labs look ok, hemoglobin still borderline. Chest xray clear. I can send his testosterone  refill however he should be aware that there is increased CV risk with hormone  replacement and given intermittent Chest pain would need to be cautious. Please see if CP has gotten any better with the reflux medicine and if he has scheduled with cardiology yet? ----- Message ----- From: Interface, [facility] Lab Results In Sent: 01/18/2024   7:36 AM EDT To: Tinnie MARLA Pro, PA-C

## 2024-01-20 NOTE — Telephone Encounter (Signed)
 Spoke with patient regarding labs/chest x-ray. Confirmed chest pain has improved so Lauren will send Testosterone .

## 2024-01-22 ENCOUNTER — Telehealth: Payer: Self-pay | Admitting: Physician Assistant

## 2024-01-22 NOTE — Telephone Encounter (Signed)
 Endocrinology referral faxed to [facility]; [phone].   Cardiology referral faxed to [facility]; [phone].   Attempted to contact patient. No answer. MB full-[name]

## 2024-01-23 ENCOUNTER — Ambulatory Visit: Admitting: Physician Assistant

## 2024-01-27 ENCOUNTER — Ambulatory Visit: Admitting: Physician Assistant

## 2024-01-28 ENCOUNTER — Ambulatory Visit

## 2024-02-02 ENCOUNTER — Other Ambulatory Visit: Payer: Self-pay | Admitting: Physician Assistant

## 2024-02-02 DIAGNOSIS — E785 Hyperlipidemia, unspecified: Secondary | ICD-10-CM

## 2024-02-02 DIAGNOSIS — I10 Essential (primary) hypertension: Secondary | ICD-10-CM

## 2024-02-27 ENCOUNTER — Ambulatory Visit: Admitting: Physician Assistant

## 2024-02-28 ENCOUNTER — Other Ambulatory Visit: Payer: Self-pay | Admitting: Physician Assistant

## 2024-02-28 DIAGNOSIS — R5383 Other fatigue: Secondary | ICD-10-CM

## 2024-02-28 DIAGNOSIS — E291 Testicular hypofunction: Secondary | ICD-10-CM

## 2024-04-01 ENCOUNTER — Ambulatory Visit

## 2024-04-03 ENCOUNTER — Telehealth: Payer: Self-pay | Admitting: Physician Assistant

## 2024-04-03 NOTE — Telephone Encounter (Signed)
 Per FG, patient waiting until after 1st of year. FG will verify with insurance again to get him scheduled-[name]

## 2024-04-07 ENCOUNTER — Other Ambulatory Visit: Payer: Self-pay | Admitting: Physician Assistant

## 2024-04-07 DIAGNOSIS — F17210 Nicotine dependence, cigarettes, uncomplicated: Secondary | ICD-10-CM

## 2024-04-07 DIAGNOSIS — I10 Essential (primary) hypertension: Secondary | ICD-10-CM

## 2024-04-07 DIAGNOSIS — E291 Testicular hypofunction: Secondary | ICD-10-CM

## 2024-04-07 DIAGNOSIS — R5383 Other fatigue: Secondary | ICD-10-CM

## 2024-10-15 ENCOUNTER — Encounter: Admitting: Physician Assistant
